# Patient Record
Sex: Male | Born: 1950 | Race: Black or African American | Hispanic: No | Marital: Married | Smoking: Never smoker
Health system: Southern US, Community
[De-identification: ages and names within clinical notes are randomized; demographics above are authoritative.]

## PROBLEM LIST (undated history)

## (undated) DIAGNOSIS — I10 Essential (primary) hypertension: Secondary | ICD-10-CM

## (undated) DIAGNOSIS — Z1211 Encounter for screening for malignant neoplasm of colon: Secondary | ICD-10-CM

---

## 2010-10-26 NOTE — Op Note (Unsigned)
PATIENT NAME:                 PA #:            MR #LEMOINE, GOYNE                 [id number]       [id number]            SURGEON:                              SURG DATE:  DIS DATE:          Irena Cords, MD                   10/26/2010  10/26/2010         DATE OF BIRTH:   AGE:           PATIENT TYPE:     RM #:              Jul 18, 1950       60             [facility]                                     COLONOSCOPY     REFERRING PHYSICIAN:  Marliss Coots, MD.     INSTRUMENT USED:  Olympus PCF-160 AL.     The patient was premedicated with Demerol 75 mg and a total of Versed 5 mg  intravenously.     INDICATIONS: The patient has a history of colonic polyps and last had a  colonoscopy in 2009.  He has guaiac positive stool.     PROCEDURE:  The digital and anal exams were normal.  The colonoscope was  inserted to the cecum.  The preparation was good.  Sigmoid diverticula were  incidentally noted.  No mucosal lesions were identified.     IMPRESSION:  Sigmoid diverticulosis only.       PLAN:  The patient requires no further evaluation.  He should undergo a  surveillance colonoscopy in 5 years.                                            Irena Cords, MD     [id number]  DD: 10/26/2010 15:44   DT: 10/27/2010 04:37   Job #: [id number]  CC: Elon Jester, MD  CC: Irena Cords, MD

## 2012-06-05 MED ORDER — FLUTICASONE PROPIONATE 50 MCG/ACT NA SUSP
50 MCG/ACT | Freq: Every day | NASAL | Status: DC
Start: 2012-06-05 — End: 2013-07-13

## 2012-06-05 NOTE — Progress Notes (Signed)
Subjective:       Brandon Mosley is a 62 y.o. male who presents for evaluation and treatment of allergic symptoms. Symptoms include: itchy eyes, nasal congestion, postnasal drip and sinus pressure and are present in a seasonal pattern. Precipitants include: none specifically recognized. Treatment currently includes nothing to date and is not effective.  Patient's medications, allergies, past medical, surgical, social and family histories were reviewed and updated as appropriate.    Review of Systems  Pertinent items are noted in HPI.  Known to me through the years with a history of polyps in the past and seasonal rhinitis, but not for several years. The patient is a non smoker and is not exposed to second hand smoke or irritants. There have been no known contagious contacts.  There are  no other symptoms referable to the upper aerodigestive tract.       Objective:      BP 159/106   Pulse 84   Ht 5\' 11"  (1.803 m)   Wt 240 lb (108.863 kg)   BMI 33.49 kg/m2   SpO2 99%  General appearance: alert, appears stated age and cooperative  Head: Normocephalic, without obvious abnormality, atraumatic, sinuses nontender to percussion  Eyes: negative findings: lids and lashes normal, conjunctivae and sclerae normal and pupils equal, round, reactive to light and accomodation  Ears: normal TM's and external ear canals both ears  Nose: mucoid discharge, mild congestion, turbinates swollen, swollen, grade 2 out of 4, no sinus tenderness, no polyps, no crusting or bleeding points  Throat: lips, mucosa, and tongue normal; teeth and gums normal      Assessment:      Allergic rhinitis.      Plan:      Medications: intranasal steroids: QNasl QD, Depo Medrol 1 cc IM.  Allergen avoidance discussed.  Follow-up in 2 weeks.

## 2013-07-21 MED ORDER — FLUTICASONE PROPIONATE 50 MCG/ACT NA SUSP
50 MCG/ACT | NASAL | Status: AC
Start: 2013-07-21 — End: ?

## 2014-04-20 ENCOUNTER — Encounter: Attending: Plastic Surgery within the Head & Neck | Primary: Family Medicine

## 2015-01-27 NOTE — Progress Notes (Signed)
[facility] PRE-OPERATIVE INSTRUCTIONS    Arrival time____0700________        Surgery time_0800___________    Take the following medications with a sip of water:    Do not eat or drink anything after 12:00 midnight prior to your surgery.EXCEPT PREP  This includes water chewing gum, mints and ice chips.   You may brush your teeth and gargle the morning of your surgery, but do not swallow the water    You may be asked to stop blood thinners such as Coumadin, Plavix, Fragmin, Lovenox, etc., or any anti-inflammatories such as:  Aspirin, Ibuprofen, Advil, Naproxen prior to your surgery.    We also ask that you stop any OTC medications such as fish oil, vitamin E, glucosamine, garlic, Multivitamins, COQ 10, etc.    We ask that you do not smoke 24 hours prior to surgery  We ask that you do not  drink any alcoholic beverages 24 hours prior to surgery     You must make arrangements for a responsible adult to take you home after your surgery.    For your safety you will not be allowed to leave alone or drive yourself home.  Your surgery will be cancelled if you do not have a ride home.     Also for your safety, it is strongly suggested that someone stay with you the first 24 hours after your surgery.     A parent or legal guardian must accompany a child scheduled for surgery and plan to stay at the hospital until the child is discharged.    Please do not bring other children with you.    For your comfort, please wear simple loose fitting clothing to the hospital.  Please do not bring valuables.    Do not wear any make-up or nail polish on your fingers or toes      For your safety, please do not wear any jewelry or body piercing's on the day of surgery.   All jewelry must be removed.      If you have dentures, they will be removed before going to operating room.    For your convenience, we will provide you with a container.    If you wear contact lenses or glasses, they will be removed, please bring a case for  them.     If you have a living will and a durable power of attorney for healthcare, please bring in a copy.     As part of our patient safety program to minimize surgical site infections, we ask you to do the following:    ?? Please notify your surgeon if you develop any illness between         now and the  day of your surgery.    ?? This includes a cough, cold, fever, sore throat, nausea,         or vomiting, and diarrhea, etc.  ??  Please notify your surgeon if you experience dizziness, shortness         of breath or blurred vision between now and the time of your surgery.     You may shower the night before surgery or the morning of   your surgery with an antibacterial soap.    You will need to bring a photo ID and insurance card    [facility] on[facility], would you like to utilize our pharmacy     If you will be staying overnight and use a C-pap  machine, please bring   your C-pap to hospital     Our goal is to provide you with excellent care, therefore, visitors will be limited to two(2) in the room at a time so that we may focus on providing this care for you.          Please contact pre-admission testing if you have any further questions.                 [facility] phone number:  [phone]  Please note these are generalized instructions for all surgical cases, you may be provided with more specific instructions according to your surgery.

## 2015-01-30 NOTE — Anesthesia Pre-Procedure Evaluation (Signed)
Department of Anesthesiology  Preprocedure Note       Name:  Brandon Mosley   Age:  65 y.o.  DOB:  1951-01-01                                          MRN:  [id number]         Date:  01/30/2015      Surgeon:    Procedure:    Medications prior to admission:   Prior to Admission medications    Medication Sig Start Date End Date Taking? Authorizing Provider   amLODIPine (NORVASC) 10 MG tablet Take 10 mg by mouth daily    Historical Provider, MD   Multiple Vitamins-Minerals (THERAPEUTIC MULTIVITAMIN-MINERALS) tablet Take 1 tablet by mouth daily    Historical Provider, MD   ferrous sulfate 325 (65 FE) MG tablet Take 325 mg by mouth daily (with breakfast)    Historical Provider, MD   fluticasone (FLONASE) 50 MCG/ACT nasal spray 1 SPRAY BY NASAL ROUTE DAILY. 07/13/13   Particia Nearing, MD   lisinopril (PRINIVIL;ZESTRIL) 10 MG tablet Take 40 mg by mouth daily     Historical Provider, MD   aspirin 81 MG tablet Take 81 mg by mouth daily.    Historical Provider, MD       Current medications:    Current Outpatient Prescriptions   Medication Sig Dispense Refill   ??? amLODIPine (NORVASC) 10 MG tablet Take 10 mg by mouth daily     ??? Multiple Vitamins-Minerals (THERAPEUTIC MULTIVITAMIN-MINERALS) tablet Take 1 tablet by mouth daily     ??? ferrous sulfate 325 (65 FE) MG tablet Take 325 mg by mouth daily (with breakfast)     ??? fluticasone (FLONASE) 50 MCG/ACT nasal spray 1 SPRAY BY NASAL ROUTE DAILY. 1 Bottle 2   ??? lisinopril (PRINIVIL;ZESTRIL) 10 MG tablet Take 40 mg by mouth daily      ??? aspirin 81 MG tablet Take 81 mg by mouth daily.       Current Facility-Administered Medications   Medication Dose Route Frequency Provider Last Rate Last Dose   ??? methylPREDNISolone acetate (DEPO-MEDROL) injection 40 mg  40 mg Intramuscular Once Particia Nearing, MD           Allergies:  No Known Allergies    Problem List:    Patient Active Problem List   Diagnosis Code   ??? Allergic rhinitis, seasonal J30.2       Past Medical History:        Diagnosis Date    ??? Hypertension        Past Surgical History:        Procedure Laterality Date   ??? Knee arthroplasty         Social History:    Social History   Substance Use Topics   ??? Smoking status: Former Smoker   ??? Smokeless tobacco: Not on file   ??? Alcohol use No                                Counseling given: Not Answered      Vital Signs (Current): There were no vitals filed for this visit.  BP Readings from Last 3 Encounters:   06/05/12 (!) 159/106       NPO Status:                                                                                 BMI:   Wt Readings from Last 3 Encounters:   01/27/15 245 lb (111.1 kg)   06/05/12 240 lb (108.9 kg)     There is no height or weight on file to calculate BMI.    Anesthesia Evaluation  Patient summary reviewed and Nursing notes reviewed no history of anesthetic complications:   Airway: Mallampati: II     Neck ROM: full   Dental: normal exam         Pulmonary:negative ROS and normal exam           Cardiovascular:negative ROS    (+) hypertension:,                Neuro/Psych:   {neg ROS     GI/Hepatic/Renal: neg ROS       (-) hiatal hernia and GERD     Endo/Other: negative ROS         Abdominal:                    Anesthesia Plan    ASA 3     general   (I discussed with the patient the risks and benefits of PIV, general anesthesia, IV Narcotics, PACU.  All questions were answered the patient agrees with the plan.)  intravenous induction         Pre-Operative Diagnosis: RECTAL BLEEDING/ [employer]    65 y.o.   BMI:  Body mass index is 34.17 kg/(m^2).     Vitals:    02/02/15 0723   BP: (!) 143/87   Pulse: 75   Resp: 16   Temp: 97.9 ??F (36.6 ??C)   TempSrc: Temporal   SpO2: 99%   Weight: 245 lb (111.1 kg)   Height:  (1.803 m)       No Known Allergies    Social History   Substance Use Topics   ??? Smoking status: Former Smoker   ??? Smokeless tobacco: Not on file   ??? Alcohol use No       LABS:    CBC  No results found for: WBC, HGB, HCT, PLT  RENAL  No  results found for: NA, K, CL, CO2, BUN, CREATININE, GLUCOSE  COAGS  No results found for: PROTIME, INR, APTT    Nash Mantis, MD   01/30/2015

## 2015-02-02 ENCOUNTER — Inpatient Hospital Stay: Admit: 2015-02-02 | Attending: Gastroenterology | Primary: Family Medicine

## 2015-02-02 MED ORDER — OXYCODONE-ACETAMINOPHEN 5-325 MG PO TABS
5-325 MG | ORAL | Status: AC | PRN
Start: 2015-02-02 — End: 2015-02-02

## 2015-02-02 MED ORDER — NORMAL SALINE FLUSH 0.9 % IV SOLN
0.9 % | Freq: Two times a day (BID) | INTRAVENOUS | Status: DC
Start: 2015-02-02 — End: 2015-02-03

## 2015-02-02 MED ORDER — MEPERIDINE HCL 25 MG/ML IJ SOLN
25 MG/ML | INTRAMUSCULAR | Status: DC | PRN
Start: 2015-02-02 — End: 2015-02-03

## 2015-02-02 MED ORDER — HYDROMORPHONE 0.5MG/0.5ML IJ SOLN
1 MG/ML | Status: DC | PRN
Start: 2015-02-02 — End: 2015-02-03

## 2015-02-02 MED ORDER — HYDRALAZINE HCL 20 MG/ML IJ SOLN
20 MG/ML | INTRAMUSCULAR | Status: DC | PRN
Start: 2015-02-02 — End: 2015-02-03

## 2015-02-02 MED ORDER — MORPHINE SULFATE (PF) 2 MG/ML IV SOLN
2 MG/ML | INTRAVENOUS | Status: DC | PRN
Start: 2015-02-02 — End: 2015-02-03

## 2015-02-02 MED ORDER — LABETALOL HCL 5 MG/ML IV SOLN
5 MG/ML | INTRAVENOUS | Status: DC | PRN
Start: 2015-02-02 — End: 2015-02-03

## 2015-02-02 MED ORDER — DIPHENHYDRAMINE HCL 50 MG/ML IJ SOLN
50 MG/ML | Freq: Once | INTRAMUSCULAR | Status: AC | PRN
Start: 2015-02-02 — End: 2015-02-02

## 2015-02-02 MED ORDER — ONDANSETRON HCL 4 MG/2ML IJ SOLN
4 MG/2ML | INTRAMUSCULAR | Status: DC | PRN
Start: 2015-02-02 — End: 2015-02-03

## 2015-02-02 MED ORDER — SODIUM CHLORIDE 0.9 % IV SOLN
0.9 % | INTRAVENOUS | Status: DC
Start: 2015-02-02 — End: 2015-02-03
  Administered 2015-02-02: 12:00:00 via INTRAVENOUS

## 2015-02-02 MED ORDER — NORMAL SALINE FLUSH 0.9 % IV SOLN
0.9 % | INTRAVENOUS | Status: DC | PRN
Start: 2015-02-02 — End: 2015-02-03

## 2015-02-02 MED ORDER — PROMETHAZINE HCL 25 MG/ML IJ SOLN
25 MG/ML | INTRAMUSCULAR | Status: DC | PRN
Start: 2015-02-02 — End: 2015-02-03

## 2015-02-02 NOTE — Anesthesia Post-Procedure Evaluation (Signed)
Postoperative Anesthesia Note    Name:    Brandon Mosley  MRN:      [id number]    Patient Vitals for the past 12 hrs:   BP Temp Temp src Pulse Resp SpO2 Height Weight   02/02/15 0838 130/84 - - 75 16 98 % - -   02/02/15 0828 118/83 - - 68 16 94 % - -   02/02/15 0818 122/82 97.5 ??F (36.4 ??C) Temporal 73 16 97 % - -   02/02/15 0723 (!) 143/87 97.9 ??F (36.6 ??C) Temporal 75 16 99 %  (1.803 m) 245 lb (111.1 kg)        LABS:    CBC  No results found for: WBC, HGB, HCT, PLT  RENAL  No results found for: NA, K, CL, CO2, BUN, CREATININE, GLUCOSE  COAGS  No results found for: PROTIME, INR, APTT    Intake & Output:  In: 620 [P.O.:120; I.V.:500]  Out: -     Nausea & Vomiting:  No    Level of Consciousness:  Awake    Pain Assessment:  Adequate analgesia    Anesthesia Complications:  No apparent anesthetic complications    SUMMARY      Vital signs stable  OK to discharge from Stage I post anesthesia care.  Care transferred from Anesthesiology department on discharge from perioperative area

## 2015-02-02 NOTE — Procedures (Signed)
[facility]             [address] [place], [place]  [address] [phone]                                      COLONOSCOPY    PATIENT NAME:  Brandon Mosley, Brandon Mosley                    DOB:      November 12, 1950  MED REC NO:    [id number]                       ROOM:        ACCOUNT NO:    [id number]                       ADMISSION DATE: 02/02/2015  PHYSICIAN:     Loraine Leriche A. Brinda Focht, MD                DATE OF PROCEDURE:  02/02/2015    This is a 65 year old male outpatient.    REFERRING PHYSICIAN:  Dr. Marliss Coots.    INSTRUMENT USED:  The Olympus PCFH180AL.    The patient was premedicated with Diprivan intravenously as administered by  the anesthesiology service.    INDICATIONS:  The patient has presented with recent rectal bleeding.  There is  a previous history of colonic polyps.  The patient's last colonoscopy was in  2012.    PROCEDURE:  The digital and anal exams were remarkable for prominent external  hemorrhoids.  The colonoscope was inserted to the cecum.  The prep was good to  excellent.  No mucosal lesions were identified.  There was incidental sigmoid  diverticulosis.    IMPRESSION:  1.  External hemorrhoids.  This would explain the patient's gross bleeding.  2.  Sigmoid diverticulosis.    PLAN:  The patient's bleeding has stopped.  If it is recurrent, it can be  treated with topical hydrocortisone.  The patient should undergo a  surveillance colonoscopy in 5 years.        Brandon Henslee A. Lorrene Reid, MD      D: 02/02/2015 08:21:37  T: 02/02/2015 09:32:51  MAM/nts  Job#: [id number]  Doc#: [id number]    cc:  Loraine Leriche A. Lorrene Reid, MD       [facility]

## 2015-02-02 NOTE — H&P (Signed)
[facility]   Pre-operative History and Physical    Patient: Brandon Mosley  DOB: 1950-02-16  Acct#:         HISTORY OF PRESENT ILLNESS:    The patient is a 65 y.o. male  who presents with rectal bleeding  Past Medical History:        Diagnosis Date   ??? Hypertension      Past Surgical History:        Procedure Laterality Date   ??? Knee arthroplasty       Medications Prior to Admission:   Current Outpatient Prescriptions on File Prior to Encounter   Medication Sig Dispense Refill   ??? amLODIPine (NORVASC) 10 MG tablet Take 10 mg by mouth daily     ??? Multiple Vitamins-Minerals (THERAPEUTIC MULTIVITAMIN-MINERALS) tablet Take 1 tablet by mouth daily     ??? ferrous sulfate 325 (65 FE) MG tablet Take 325 mg by mouth daily (with breakfast)     ??? lisinopril (PRINIVIL;ZESTRIL) 10 MG tablet Take 40 mg by mouth daily      ??? aspirin 81 MG tablet Take 81 mg by mouth daily.     ??? fluticasone (FLONASE) 50 MCG/ACT nasal spray 1 SPRAY BY NASAL ROUTE DAILY. 1 Bottle 2     Current Facility-Administered Medications on File Prior to Encounter   Medication Dose Route Frequency Provider Last Rate Last Dose   ??? methylPREDNISolone acetate (DEPO-MEDROL) injection 40 mg  40 mg Intramuscular Once Particia Nearing, MD         Allergies:  Review of patient's allergies indicates no known allergies.    Social History:      Social History     Social History   ??? Marital status: Married     Spouse name: N/A   ??? Number of children: N/A   ??? Years of education: N/A     Occupational History   ??? Not on file.     Social History Main Topics   ??? Smoking status: Former Smoker   ??? Smokeless tobacco: Not on file   ??? Alcohol use No   ??? Drug use: No   ??? Sexual activity: Not on file     Other Topics Concern   ??? Not on file     Social History Narrative           Family History:   History reviewed. No pertinent family history.      PHYSICAL EXAM:      Visit Vitals   ??? BP (!) 143/87   ??? Pulse 75   ??? Temp 97.9 ??F (36.6 ??C) (Temporal)   ??? Resp 16   ??? Ht  (1.803 m)   ???  Wt 245 lb (111.1 kg)   ??? SpO2 99%   ??? BMI 34.17 kg/m2    I        Heart: Normal    Lungs: Normal    Abdomen: Normal      ASA Grade: ASA 3 - Patient with moderate systemic disease with functional limitations    II (soft palate, uvula, fauces visible)  ASSESSMENT AND PLAN:    1.  Patient is a 65 y.o. male here for Colonoscopy  2.  Procedure options, risks and benefits reviewed with patient who expresses understanding.

## 2015-02-02 NOTE — Progress Notes (Signed)
Drank Juice without difficulty. Patient and wife voice understanding of discharge instructions.c

## 2015-02-02 NOTE — Progress Notes (Signed)
1.  Identify name and date of birth.  2.  Patient remains free from signs and symptoms of injury.  3.  Patient receives appropriate medication(s), safely administered during the       Perioperative period.  4.  The patient is free from signs and symptoms of infection.  5.  The patient has wound/tissur perfusion.  6.  The patient's fluid, electrolyte, and acid-base balances are established perioperatively.  7.  The patient's pulmonary function is established preoperatively.  8.  The patient's cardiovascular status is established perioperatively.  9.  The patient/caregiver demonstrates knowledge of nutritional management related to the operative or other invasive procedure.  10.  The patient/caregiver demonstrates knowledge of medication management.  11.  The patient/caregiver demonstrates knowledge of pain management.  12.  The patient participates in the rehabilitation process as applicable.  13.  The patient/caregiver participates in decisions in decisions affecting his or her Perioperative plan of care.  14.  The patients care is consistent with the individualized Perioperative plan of care.  15.  The patients right to privacy is maintained.  16.  The patient is the recipient of competent and ethical care within legal standards of practice.  17.  The patient's value system, lifestyle, ethnicity, and culture are considered, respected, and incorporated int the perioperative plan of care and understands special services available.  18.  The patient demonstrates and/or reports adequate pain control throughout the perioperative period.  19.  The patient's neurological status is established preoperatively.  20.  The patient/caregiver demonstrates knowledge of the expected responses to the operative or invasive procedure.  21.  Patient/caregiver has reduced anxiety.  Interventions - Familiarize with environment and equipment.  22.  Patient/caregiver verbalizes understanding of Phase I and Phase II process.  23.  Patient  pain level is established preoperatively using age appropriate pain scale.

## 2015-02-02 NOTE — Op Note (Signed)
Colonoscopy - dictated.

## 2015-02-02 NOTE — Brief Op Note (Signed)
Brief Postoperative Note    Brandon Mosley  Date of Birth:  1951/01/02  [id number]    Pre-operative Diagnosis: Rectal bleeding    Post-operative Diagnosis: Same    Procedure: Colonoscopy    Anesthesia: MAC    Surgeons/Assistants: Victorhugo Preis    Estimated Blood Loss: None    Complications: None    Specimens: Was Not Obtained    Findings: See dictated report    Electronically signed by Irena Cords, MD on 02/02/2015 at 8:17 AM

## 2015-02-02 NOTE — Other (Signed)
Medications administered and monitored by CRNA, see anesthesia record.

## 2015-02-02 NOTE — Progress Notes (Signed)
1.  Patient/Caregiver identifies-states name and date of birth  2.  The patient is free from signs & symptoms of chemical, electrical, laser, radiation, positioning or transfer/transport injury.  3.  The patient receives appropriate medication(s) safely administered during the Perioperative period.  4.  The patient has wound/tissue perfusion consistent with or improved from baseline levels established preoperatively  5. The patient is at or returning to normothermia at the conclusion of the immediate postoperative period.  6.  The patient's fluid, electrolyte, and acid base balances are consistent with or improved from baseline levels established preoperatively  7.  The patient's pulmonary function is consistent with or improved from baseline levels established preoperatively  8.  The patient's cardiovascular status is consistent with or improved from baseline levels established preoperatively  9.  The patient/caregiver demonstrates knowledge of nutritional management related to the operative or other invasive procedure.  10.  The patient/caregiver demonstrates knowledge of medication, pain, and wound management.  11.  The patient participates in the rehabilitation process as applicable  12.  The patient/caregiver participates in decisions affecting his or her Perioperative plan of care  13.  The patient's care is consistent with the individualized Perioperative plan of care  14.  The patient's right to privacy is maintained.  15.  The patient is the recipient of competent and ethical care within legal standards of practice.  16. The patient's value system, lifestyle, ethnicity, and culture are considered, respected, and incorporated in the Perioperative plan of care. Verbalizes understanding of special services available.  17.  The patient demonstrated and/or reports adequate pain control throughout the Perioperative period.  18.  The patient's neurological status is consistent with or improved from baseline levels  established preoperatively.  19.  The patient/caregiver demonstrates knowledge of the expected responses to the operative or invasive procedure.  20.  Patient/Caregiver has reduced anxiety. Interventions-Familiarize with environment and equipment.  21.  Other:  22.  Other:

## 2015-02-03 MED FILL — FRESENIUS PROPOVEN 200 MG/20ML IV EMUL: 200 MG/20ML | INTRAVENOUS | Qty: 20

## 2015-02-03 MED FILL — LIDOCAINE HCL (PF) 2 % IJ SOLN: 2 % | INTRAMUSCULAR | Qty: 5

## 2015-03-08 ENCOUNTER — Encounter: Attending: Plastic Surgery within the Head & Neck | Primary: Family Medicine

## 2016-10-01 ENCOUNTER — Emergency Department (HOSPITAL_COMMUNITY)
Admission: EM | Admit: 2016-10-01 | Discharge: 2016-10-02 | Disposition: A | Discharge status: Home / Self Care | Payer: 59 | Attending: Emergency Medicine | Admitting: Emergency Medicine

## 2016-10-01 ENCOUNTER — Encounter (HOSPITAL_COMMUNITY): Payer: Self-pay | Admitting: Emergency Medicine

## 2016-10-01 ENCOUNTER — Emergency Department (HOSPITAL_COMMUNITY): Payer: 59

## 2016-10-01 DIAGNOSIS — Y929 Unspecified place or not applicable: Secondary | ICD-10-CM | POA: Diagnosis not present

## 2016-10-01 DIAGNOSIS — M545 Low back pain, unspecified: Secondary | ICD-10-CM

## 2016-10-01 DIAGNOSIS — S32028A Other fracture of second lumbar vertebra, initial encounter for closed fracture: Secondary | ICD-10-CM | POA: Insufficient documentation

## 2016-10-01 DIAGNOSIS — S63285A Dislocation of proximal interphalangeal joint of left ring finger, initial encounter: Secondary | ICD-10-CM | POA: Insufficient documentation

## 2016-10-01 DIAGNOSIS — I10 Essential (primary) hypertension: Secondary | ICD-10-CM | POA: Diagnosis not present

## 2016-10-01 DIAGNOSIS — Y9389 Activity, other specified: Secondary | ICD-10-CM | POA: Insufficient documentation

## 2016-10-01 DIAGNOSIS — M542 Cervicalgia: Secondary | ICD-10-CM | POA: Diagnosis not present

## 2016-10-01 DIAGNOSIS — S63283A Dislocation of proximal interphalangeal joint of left middle finger, initial encounter: Secondary | ICD-10-CM | POA: Insufficient documentation

## 2016-10-01 DIAGNOSIS — S2232XA Fracture of one rib, left side, initial encounter for closed fracture: Secondary | ICD-10-CM

## 2016-10-01 DIAGNOSIS — W11XXXA Fall on and from ladder, initial encounter: Secondary | ICD-10-CM | POA: Insufficient documentation

## 2016-10-01 DIAGNOSIS — S62115A Nondisplaced fracture of triquetrum [cuneiform] bone, left wrist, initial encounter for closed fracture: Secondary | ICD-10-CM | POA: Insufficient documentation

## 2016-10-01 DIAGNOSIS — S63275A Dislocation of unspecified interphalangeal joint of left ring finger, initial encounter: Secondary | ICD-10-CM

## 2016-10-01 DIAGNOSIS — Z79899 Other long term (current) drug therapy: Secondary | ICD-10-CM | POA: Diagnosis not present

## 2016-10-01 DIAGNOSIS — G311 Senile degeneration of brain, not elsewhere classified: Secondary | ICD-10-CM | POA: Insufficient documentation

## 2016-10-01 DIAGNOSIS — S6992XA Unspecified injury of left wrist, hand and finger(s), initial encounter: Secondary | ICD-10-CM | POA: Diagnosis present

## 2016-10-01 DIAGNOSIS — Y998 Other external cause status: Secondary | ICD-10-CM | POA: Insufficient documentation

## 2016-10-01 DIAGNOSIS — S63273A Dislocation of unspecified interphalangeal joint of left middle finger, initial encounter: Secondary | ICD-10-CM

## 2016-10-01 HISTORY — DX: Essential (primary) hypertension: I10

## 2016-10-01 MED ORDER — SODIUM CHLORIDE 0.9 % IV BOLUS (SEPSIS)
500.0000 mL | Freq: Once | INTRAVENOUS | Status: AC
  Administered 2016-10-02: 500 mL via INTRAVENOUS

## 2016-10-01 MED ORDER — FENTANYL CITRATE (PF) 100 MCG/2ML IJ SOLN
INTRAMUSCULAR | Status: AC
  Administered 2016-10-01: 50 ug via NASAL
  Filled 2016-10-01: qty 2

## 2016-10-01 MED ORDER — ONDANSETRON HCL 4 MG/2ML IJ SOLN
4.0000 mg | Freq: Once | INTRAMUSCULAR | Status: AC
  Administered 2016-10-02: 4 mg via INTRAVENOUS
  Filled 2016-10-01: qty 2

## 2016-10-01 MED ORDER — MORPHINE SULFATE (PF) 4 MG/ML IV SOLN
4.0000 mg | Freq: Once | INTRAVENOUS | Status: AC
  Administered 2016-10-02: 4 mg via INTRAVENOUS
  Filled 2016-10-01: qty 1

## 2016-10-01 MED ORDER — FENTANYL CITRATE (PF) 100 MCG/2ML IJ SOLN: 50.0000 ug | INTRAMUSCULAR | Status: DC | PRN

## 2016-10-01 MED ORDER — OXYCODONE-ACETAMINOPHEN 5-325 MG PO TABS
1.0000 | ORAL_TABLET | ORAL | Status: DC | PRN
  Administered 2016-10-01: 1 via ORAL

## 2016-10-01 MED ORDER — OXYCODONE-ACETAMINOPHEN 5-325 MG PO TABS
ORAL_TABLET | ORAL | Status: AC
  Filled 2016-10-01: qty 1

## 2016-10-01 NOTE — ED Notes (Signed)
Pt. Fell maybe 15 feet.

## 2016-10-01 NOTE — ED Notes (Signed)
Patient in triage waiting in recliner, helps pain a bit. Pain =7/10. Family with patient.

## 2016-10-01 NOTE — ED Notes (Signed)
Family up to [facility] to say patient is in excruciating pain. Patient brought to Triage Waiting and will give Triage pain meds.

## 2016-10-01 NOTE — ED Provider Notes (Signed)
[facility]-EMERGENCY DEPT Provider Note   CSN: [id number] Arrival date & time: 10/01/16  1757     History   Chief Complaint Chief Complaint  Patient presents with  . Fall    fall from ladder, about 15 feet, about 1h ago. No LOC    HPI James Odonnell is a 66 y.o. male.  James Odonnell is a 66 y.o. Male who presents to the ED after a fall off a ladder prior to arrival. Patient repots he was up on a ladder about 15 feet in a field trimming trees when he fell onto the ground hitting his back. He denies hitting his head or LOC. He reports pain to his mid to lower back that radiates around into his chest. He denies shortness of breath. He also reports developing some mild neck pain diffusely. He also complains of pain to his left hand and wrist as he braced his fall with his hand. He received fentanyl and Percocet in the emergency department with some relief of his pain. He ambulated after the fall prior to arrival to the emergency department. He denies fevers, headache, loss of consciousness, loss of bladder control, loss of bowel control, numbness, tingling, weakness or rashes.   The history is provided by the patient and medical records. No language interpreter was used.  Fall  Associated symptoms include chest pain. Pertinent negatives include no abdominal pain, no headaches and no shortness of breath.    Past Medical History:  Diagnosis Date  . Hypertension     There are no active problems to display for this patient.   No past surgical history on file.     Home Medications    Prior to Admission medications   Medication Sig Start Date End Date Taking? Authorizing Provider  amLODipine (NORVASC) 10 MG tablet Take 10 mg by mouth daily. 08/27/16  Yes [provider]  hydrALAZINE (APRESOLINE) 50 MG tablet Take 50 mg by mouth daily. 07/21/16  Yes [provider]  lisinopril (PRINIVIL,ZESTRIL) 40 MG tablet Take 40 mg by mouth daily. 08/19/16  Yes [provider]     Family History No family history on file.  Social History Social History  Substance Use Topics  . Smoking status: Never Smoker  . Smokeless tobacco: Never Used  . Alcohol use Not on file     Allergies   Patient has no known allergies.   Review of Systems Review of Systems  Constitutional: Negative for chills and fever.  HENT: Negative for nosebleeds.   Eyes: Negative for visual disturbance.  Respiratory: Negative for cough and shortness of breath.   Cardiovascular: Positive for chest pain.  Gastrointestinal: Negative for abdominal pain, diarrhea, nausea and vomiting.  Genitourinary: Negative for difficulty urinating, dysuria and hematuria.  Musculoskeletal: Positive for arthralgias, back pain and neck pain.  Skin: Negative for rash and wound.  Neurological: Negative for dizziness, syncope, weakness, light-headedness, numbness and headaches.     Physical Exam Updated Vital Signs BP 129/75 (BP Location: Right Arm)   Pulse 85   Temp 97.8 F (36.6 C) (Oral)   Resp 14   Ht 5\' 11"  (1.803 m)   Wt 106.6 kg (235 lb)   SpO2 95%   BMI 32.78 kg/m   Physical Exam  Constitutional: He appears well-developed and well-nourished. No distress.  Nontoxic appearing.  HENT:  Head: Normocephalic and atraumatic.  Right Ear: External ear normal.  Left Ear: External ear normal.  Mouth/Throat: Oropharynx is clear and moist.  Eyes: Pupils are equal, round,  and reactive to light. Conjunctivae and EOM are normal. Right eye exhibits no discharge. Left eye exhibits no discharge.  Neck: Normal range of motion. Neck supple. No JVD present. No tracheal deviation present.  No midline neck tenderness to palpation.  Cardiovascular: Normal rate, regular rhythm, normal heart sounds and intact distal pulses.  Exam reveals no gallop and no friction rub.   No murmur heard. Bilateral radial and dorsalis pedis pulses are intact.  Pulmonary/Chest: Effort normal and breath sounds normal. No stridor.  No respiratory distress. He has no wheezes. He has no rales. He exhibits no tenderness.  Lungs are clear to ascultation bilaterally. Symmetric chest expansion bilaterally. No increased work of breathing. No rales or rhonchi.  No crepitus.   Abdominal: Soft. There is no tenderness.  Musculoskeletal: Normal range of motion. He exhibits tenderness. He exhibits no edema or deformity.  Patient has tenderness mildly to his left lateral wrist and 3rd and 4th fingers at PIP joint with probable dislocation. Good range of motion. No crackles or malalignment. No deformity noted. No tenderness to palpation to his midline cervical, thoracic or lumbar spine. No crepitus or deformity. No overlying skin changes. He is moving his bilateral upper and lower extremities with good strength and without difficulty. His bilateral clavicles are nontender to palpation. His bilateral shoulder, elbow, hip, knee and ankle joints are supple and nontender to palpation.  Lymphadenopathy:    He has no cervical adenopathy.  Neurological: He is alert. No sensory deficit. He exhibits normal muscle tone. Coordination normal.  Good strength in his bilateral upper and lower extremities. Strength and sensation is intact in his bilateral upper and lower extremities. Good strength with plantar and dorsiflexion bilaterally. Good and equal grip strengths bilaterally.  Skin: Skin is warm and dry. No rash noted. He is not diaphoretic. No erythema. No pallor.  Psychiatric: He has a normal mood and affect. His behavior is normal.  Nursing note and vitals reviewed.    ED Treatments / Results  Labs (all labs ordered are listed, but only abnormal results are displayed) Labs Reviewed  COMPREHENSIVE METABOLIC PANEL - Abnormal; Notable for the following:       Result Value   Glucose, Bld 128 (*)    BUN 25 (*)    Creatinine, Ser 1.62 (*)    AST 122 (*)    GFR calc non Af Amer 43 (*)    GFR calc Af Amer 49 (*)    All other components within  normal limits  CBC - Abnormal; Notable for the following:    WBC 14.8 (*)    Hemoglobin 12.6 (*)    HCT 38.2 (*)    MCV 66.3 (*)    MCH 21.9 (*)    RDW 15.6 (*)    All other components within normal limits  I-STAT CHEM 8, ED - Abnormal; Notable for the following:    BUN 27 (*)    Creatinine, Ser 1.50 (*)    Glucose, Bld 129 (*)    Calcium, Ion 1.14 (*)    All other components within normal limits  PROTIME-INR  URINALYSIS, ROUTINE W REFLEX MICROSCOPIC    EKG  EKG Interpretation None       Radiology Dg Wrist Complete Left  Result Date: 10/01/2016 CLINICAL DATA:  15 foot fall from ladder.  Left wrist pain. EXAM: LEFT WRIST - COMPLETE 3+ VIEW COMPARISON:  None. FINDINGS: Tiny linear lucency along the distal aspect of the triquetrum bone may represent nondisplaced fracture. Mild dorsal soft  tissue swelling. No other fracture or dislocation identified. No radiopaque soft tissue foreign bodies. No focal bone lesions. IMPRESSION: Possible nondisplaced fracture along the distal aspect of the triquetrum bone. Electronically Signed   By: Burman Nieves M.D.   On: 10/01/2016 23:47   Ct Head Wo Contrast  Result Date: 10/02/2016 CLINICAL DATA:  Patient fell 15 feet from a ladder. History of hypertension. EXAM: CT HEAD WITHOUT CONTRAST CT CERVICAL SPINE WITHOUT CONTRAST TECHNIQUE: Multidetector CT imaging of the head and cervical spine was performed following the standard protocol without intravenous contrast. Multiplanar CT image reconstructions of the cervical spine were also generated. COMPARISON:  None. FINDINGS: CT HEAD FINDINGS Brain: Mild diffuse cerebral atrophy. No ventricular dilatation. No mass effect or midline shift. No abnormal extra-axial fluid collections. Gray-white matter junctions are distinct. Basal cisterns are not effaced. No acute intracranial hemorrhage. Vascular: No hyperdense vessel or unexpected calcification. Skull: Calvarium appears intact. No acute depressed  fractures identified. Sinuses/Orbits: Mucosal thickening in the paranasal sinuses. No acute air-fluid levels. Mastoid air cells are not opacified. Other: None. CT CERVICAL SPINE FINDINGS Alignment: Reversal of the usual cervical lordosis without anterior subluxation. This is likely due to patient positioning but ligamentous injury or muscle spasm could also have this appearance and are not excluded. Normal alignment of the facet joints. C1-2 articulation appears intact. Skull base and vertebrae: No vertebral compression deformities. No focal bone lesion or bone destruction. 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 odontoid process. 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 Soft tissues and spinal canal: No prevertebral soft tissue swelling. No paraspinal infiltration. Disc levels: Degenerative changes throughout the cervical spine with narrowed interspaces and associated endplate hypertrophic changes. Changes are most prominent at C4-5 and C5-6. Prominent posterior disc osteophyte complex at C4-5 causes some effacement of the anterior thecal sac. No significant stenosis. Upper chest: Lung apices are clear. Other: None. IMPRESSION: 1. No acute intracranial abnormalities.  Mild diffuse atrophy. 2. Nonspecific reversal of the usual cervical lordosis. No acute displaced fractures identified in the cervical spine. Degenerative changes. Electronically Signed   By: Burman Nieves M.D.   On: 10/02/2016 01:11   Ct Chest W Contrast  Result Date: 10/02/2016 CLINICAL DATA:  Patient fell 15 feet from a ladder. History of hypertension. EXAM: CT CHEST, ABDOMEN, AND PELVIS WITH CONTRAST TECHNIQUE: Multidetector CT imaging of the chest, abdomen and pelvis was performed following the standard protocol during bolus administration of intravenous contrast. CONTRAST:  ISOVUE-300 IOPAMIDOL (ISOVUE-300) INJECTION 61% COMPARISON:  None. FINDINGS: CT  CHEST FINDINGS Cardiovascular: Normal heart size. No pericardial effusion. Normal caliber thoracic aorta. Aorta and great vessel origins are patent. No evidence of dissection or aneurysm. Mediastinum/Nodes: No enlarged mediastinal, hilar, or axillary lymph nodes. Thyroid gland, trachea, and esophagus demonstrate no significant findings. Lungs/Pleura: Bilateral lung base opacities may represent atelectasis or small contusions. No pleural effusions. No pneumothorax. Airways are patent. Musculoskeletal: Normal alignment of the thoracic spine. Diffuse degenerative changes. No vertebral compression deformities. No sternal depression. Oblique fracture of the posterior left eighth rib without significant depression. Small adjacent paraspinal hematoma. CT ABDOMEN PELVIS FINDINGS Hepatobiliary: No hepatic injury or perihepatic hematoma. Gallbladder is unremarkable Pancreas: Unremarkable. No pancreatic ductal dilatation or surrounding inflammatory changes. Spleen: No splenic injury or perisplenic hematoma. Adrenals/Urinary Tract: No adrenal hemorrhage or renal injury identified. Bladder is unremarkable. Stomach/Bowel: Stomach is within normal limits. Appendix appears normal. No evidence of bowel wall thickening, distention,  or inflammatory changes. Vascular/Lymphatic: No significant vascular findings are present. No enlarged abdominal or pelvic lymph nodes. Reproductive: Prostate is unremarkable. Other: No free air or free fluid in the abdomen. Abdominal wall musculature appears intact. Infiltration in the subcutaneous fat lateral to the left hip consistent with contusion/hematoma. Musculoskeletal: Normal alignment of the lumbar spine. Oblique fracture at the anterior superior endplate of the L2 vertebral body. No displacement or retropulsion of fracture fragments. No significant vertebral compression. Pelvis, sacrum, and hips appear intact. Tiny ossicle posterior to the left ischium likely represents an old ununited ossicle.  Can't entirely exclude a small avulsion fragment. IMPRESSION: 1. Oblique fracture of the posterior left eighth rib without depression. 2. Bilateral lung base opacities may represent atelectasis or small contusions. 3. No evidence of mediastinal or aortic injury. 4. Oblique fracture of the anterior superior endplate of the L2 vertebral body without compression. 5. No evidence of solid organ injury or bowel perforation. Electronically Signed   By: Burman Nieves M.D.   On: 10/02/2016 01:42   Ct Cervical Spine Wo Contrast  Result Date: 10/02/2016 CLINICAL DATA:  Patient fell 15 feet from a ladder. History of hypertension. EXAM: CT HEAD WITHOUT CONTRAST CT CERVICAL SPINE WITHOUT CONTRAST TECHNIQUE: Multidetector CT imaging of the head and cervical spine was performed following the standard protocol without intravenous contrast. Multiplanar CT image reconstructions of the cervical spine were also generated. COMPARISON:  None. FINDINGS: CT HEAD FINDINGS Brain: Mild diffuse cerebral atrophy. No ventricular dilatation. No mass effect or midline shift. No abnormal extra-axial fluid collections. Gray-white matter junctions are distinct. Basal cisterns are not effaced. No acute intracranial hemorrhage. Vascular: No hyperdense vessel or unexpected calcification. Skull: Calvarium appears intact. No acute depressed fractures identified. Sinuses/Orbits: Mucosal thickening in the paranasal sinuses. No acute air-fluid levels. Mastoid air cells are not opacified. Other: None. CT CERVICAL SPINE FINDINGS Alignment: Reversal of the usual cervical lordosis without anterior subluxation. This is likely due to patient positioning but ligamentous injury or muscle spasm could also have this appearance and are not excluded. Normal alignment of the facet joints. C1-2 articulation appears intact. Skull base and vertebrae: No vertebral compression deformities. No focal bone lesion or bone destruction. Partial coalition of the posterior  elements at C2-3 on the left. Degenerative cyst in the odontoid process. 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 Soft tissues and spinal canal: No prevertebral soft tissue swelling. No paraspinal infiltration. Disc levels: Degenerative changes throughout the cervical spine with narrowed interspaces and associated endplate hypertrophic changes. Changes are most prominent at C4-5 and C5-6. Prominent posterior disc osteophyte complex at C4-5 causes some effacement of the anterior thecal sac. No significant stenosis. Upper chest: Lung apices are clear. Other: None. IMPRESSION: 1. No acute intracranial abnormalities.  Mild diffuse atrophy. 2. Nonspecific reversal of the usual cervical lordosis. No acute displaced fractures identified in the cervical spine. Degenerative changes. Electronically Signed   By: Burman Nieves M.D.   On: 10/02/2016 01:11   Ct Abdomen Pelvis W Contrast  Result Date: 10/02/2016 CLINICAL DATA:  Patient fell 15 feet from a ladder. History of hypertension. EXAM: CT CHEST, ABDOMEN, AND PELVIS WITH CONTRAST TECHNIQUE: Multidetector CT imaging of the chest, abdomen and pelvis was performed following the standard protocol during bolus administration of intravenous contrast. CONTRAST:  ISOVUE-300 IOPAMIDOL (ISOVUE-300) INJECTION 61% COMPARISON:  None. FINDINGS: CT CHEST FINDINGS Cardiovascular: Normal heart size. No pericardial effusion. Normal caliber thoracic aorta. Aorta and great vessel origins are patent. No  evidence of dissection or aneurysm. Mediastinum/Nodes: No enlarged mediastinal, hilar, or axillary lymph nodes. Thyroid gland, trachea, and esophagus demonstrate no significant findings. Lungs/Pleura: Bilateral lung base opacities may represent atelectasis or small contusions. No pleural effusions. No pneumothorax. Airways are patent. Musculoskeletal: Normal alignment of the thoracic spine. Diffuse degenerative changes. No vertebral compression  deformities. No sternal depression. Oblique fracture of the posterior left eighth rib without significant depression. Small adjacent paraspinal hematoma. CT ABDOMEN PELVIS FINDINGS Hepatobiliary: No hepatic injury or perihepatic hematoma. Gallbladder is unremarkable Pancreas: Unremarkable. No pancreatic ductal dilatation or surrounding inflammatory changes. Spleen: No splenic injury or perisplenic hematoma. Adrenals/Urinary Tract: No adrenal hemorrhage or renal injury identified. Bladder is unremarkable. Stomach/Bowel: Stomach is within normal limits. Appendix appears normal. No evidence of bowel wall thickening, distention, or inflammatory changes. Vascular/Lymphatic: No significant vascular findings are present. No enlarged abdominal or pelvic lymph nodes. Reproductive: Prostate is unremarkable. Other: No free air or free fluid in the abdomen. Abdominal wall musculature appears intact. Infiltration in the subcutaneous fat lateral to the left hip consistent with contusion/hematoma. Musculoskeletal: Normal alignment of the lumbar spine. Oblique fracture at the anterior superior endplate of the L2 vertebral body. No displacement or retropulsion of fracture fragments. No significant vertebral compression. Pelvis, sacrum, and hips appear intact. Tiny ossicle posterior to the left ischium likely represents an old ununited ossicle. Can't entirely exclude a small avulsion fragment. IMPRESSION: 1. Oblique fracture of the posterior left eighth rib without depression. 2. Bilateral lung base opacities may represent atelectasis or small contusions. 3. No evidence of mediastinal or aortic injury. 4. Oblique fracture of the anterior superior endplate of the L2 vertebral body without compression. 5. No evidence of solid organ injury or bowel perforation. Electronically Signed   By: Burman Nieves M.D.   On: 10/02/2016 01:42   Dg Chest Port 1 View  Result Date: 10/01/2016 CLINICAL DATA:  Fall from ladder 15 feet a couple of  hours ago. Back pain radiating to the chest. EXAM: PORTABLE CHEST 1 VIEW COMPARISON:  None. FINDINGS: Shallow inspiration. Normal heart size and pulmonary vascularity. No focal airspace disease or consolidation in the lungs. No blunting of costophrenic angles. No pneumothorax. Mediastinal contours appear intact. IMPRESSION: No active disease. Electronically Signed   By: Burman Nieves M.D.   On: 10/01/2016 23:44   Dg Hand Complete Left  Result Date: 10/01/2016 CLINICAL DATA:  15 foot fall from ladder a couple of hours ago. Left hand pain. EXAM: LEFT HAND - COMPLETE 3+ VIEW COMPARISON:  None. FINDINGS: This is a limited single view study. A lateral view would help for confirmation, but there appears to be dislocation of the proximal interphalangeal joints at the third and fourth fingers. Chronic degenerative changes in the distal interphalangeal joints. Soft tissues are unremarkable. IMPRESSION: Dislocation of the proximal interphalangeal joints at the left third and fourth fingers. Electronically Signed   By: Burman Nieves M.D.   On: 10/01/2016 23:45    Procedures Reduction of dislocation Date/Time: 10/02/2016 12:57 AM Performed by: Everlene Farrier Authorized by: Everlene Farrier  Consent: Verbal consent obtained. Risks and benefits: risks, benefits and alternatives were discussed Consent given by: patient Patient understanding: patient states understanding of the procedure being performed Site marked: the operative site was marked Imaging studies: imaging studies available Required items: required blood products, implants, devices, and special equipment available Patient identity confirmed: verbally with patient Time out: Immediately prior to procedure a "time out" was called to verify the correct patient, procedure, equipment, support staff and  site/side marked as required. Local anesthesia used: yes Anesthesia: digital block  Anesthesia: Local anesthesia used: yes Local Anesthetic:  lidocaine 1% without epinephrine Anesthetic total: 3 mL  Sedation: Patient sedated: no Patient tolerance: Patient tolerated the procedure well with no immediate complications Comments: Reduction of dislocation of left 3rd finger at PIP joint.   Reduction of dislocation Date/Time: 10/02/2016 1:09 AM Performed by: Everlene Farrier Authorized by: Everlene Farrier  Consent: Verbal consent obtained. Risks and benefits: risks, benefits and alternatives were discussed Consent given by: patient Patient understanding: patient states understanding of the procedure being performed Site marked: the operative site was marked Imaging studies: imaging studies available Required items: required blood products, implants, devices, and special equipment available Patient identity confirmed: verbally with patient Time out: Immediately prior to procedure a "time out" was called to verify the correct patient, procedure, equipment, support staff and site/side marked as required. Local anesthesia used: yes Anesthesia: digital block  Anesthesia: Local anesthesia used: yes Local Anesthetic: lidocaine 1% without epinephrine Anesthetic total: 3 mL  Sedation: Patient sedated: no Patient tolerance: Patient tolerated the procedure well with no immediate complications Comments: Reduction of dislocation of left 4th finger at PIP joint.     (including critical care time)  Medications Ordered in ED Medications  oxyCODONE-acetaminophen (PERCOCET/ROXICET) 5-325 MG per tablet 1 tablet (1 tablet Oral Given 10/01/16 1859)  fentaNYL (SUBLIMAZE) injection 50 mcg (not administered)  sodium chloride 0.9 % bolus 500 mL (not administered)  fentaNYL (SUBLIMAZE) 100 MCG/2ML injection (50 mcg Right Nare Given 10/01/16 2052)  sodium chloride 0.9 % bolus 500 mL (0 mLs Intravenous Stopped 10/02/16 0123)  morphine 4 MG/ML injection 4 mg (4 mg Intravenous Given 10/02/16 0001)  ondansetron (ZOFRAN) injection 4 mg (4 mg Intravenous  Given 10/02/16 0001)  lidocaine (PF) (XYLOCAINE) 1 % injection 10 mL (10 mLs Intradermal Given 10/02/16 0123)  iopamidol (ISOVUE-300) 61 % injection (100 mLs  Contrast Given 10/02/16 0055)  morphine 4 MG/ML injection 4 mg (4 mg Intravenous Given 10/02/16 0123)     Initial Impression / Assessment and Plan / ED Course  I have reviewed the triage vital signs and the nursing notes.  Pertinent labs & imaging results that were available during my care of the patient were reviewed by me and considered in my medical decision making (see chart for details).    This is a 66 y.o. Male who presents to the ED after a fall off a ladder prior to arrival. Patient repots he was up on a ladder about 15 feet in a field trimming trees when he fell onto the ground hitting his back. He denies hitting his head or LOC. He reports pain to his mid to lower back that radiates around into his chest. He denies shortness of breath. He also reports developing some mild neck pain diffusely. He also complains of pain to his left hand and wrist as he braced his fall with his hand. He received fentanyl and Percocet in the emergency department with some relief of his pain. He ambulated after the fall prior to arrival to the emergency department.  On examination patient is afebrile nontoxic appearing. He has no midline neck or back tenderness to palpation. No crepitus or deformity. He has no focal neurological deficits. He has deformity and probable dislocation at his PIP joint of his left third and fourth fingers. No open wounds.  CT head and neck without contrast showed no acute findings. X-ray of his left hand shows dislocations at his PIP joint  of his left third and fourth fingers. Is also possible nondisplaced fracture through his triquetrum of his left wrist. Both finger joints were reduced by myself and taught her the well by the patient. Will obtain repeat x-ray to confirm.  CT chest and abdomen and pelvis with contrast shows an  oblique fracture of the posterior left eighth rib without depression. There is also an oblique fracture of the anterior superior endplate of the L2 vertebral body without compression.  At shift change patient is awaiting repeat x-ray of his left hand as well as consult to neurosurgery. Dr. Manus Gunning plans to speak to neurosurgery about his fracture. James Drape, PA-C will follow up after repeat hand x-ray. Plan is likely for discharge after neurosurgery consult and if patient can ambulate.    Final Clinical Impressions(s) / ED Diagnoses   Final diagnoses:  Fall from ladder, initial encounter  Closed dislocation of interphalangeal joint of left middle finger  Closed dislocation of interphalangeal joint of left ring finger  Closed nondisplaced fracture of triquetrum of left wrist, initial encounter  Acute bilateral low back pain without sciatica  Neck pain  Closed fracture of one rib of left side, initial encounter  Other closed fracture of second lumbar vertebra, initial encounter [facility])    New Prescriptions New Prescriptions   No medications on file     Everlene Farrier, Cordelia Poche 10/02/16 0205    Glynn Octave, MD 10/02/16 [phone]

## 2016-10-01 NOTE — ED Triage Notes (Signed)
Patient presents with severe back pain after falling 78ft off ladder

## 2016-10-02 ENCOUNTER — Other Ambulatory Visit: Payer: Self-pay

## 2016-10-02 ENCOUNTER — Emergency Department (HOSPITAL_COMMUNITY): Payer: 59

## 2016-10-02 LAB — I-STAT CHEM 8, ED
BUN: 27 mg/dL — ABNORMAL HIGH (ref 6–20)
CHLORIDE: 109 mmol/L (ref 101–111)
Calcium, Ion: 1.14 mmol/L — ABNORMAL LOW (ref 1.15–1.40)
Creatinine, Ser: 1.5 mg/dL — ABNORMAL HIGH (ref 0.61–1.24)
Glucose, Bld: 129 mg/dL — ABNORMAL HIGH (ref 65–99)
HEMATOCRIT: 41 % (ref 39.0–52.0)
Hemoglobin: 13.9 g/dL (ref 13.0–17.0)
Potassium: 3.9 mmol/L (ref 3.5–5.1)
SODIUM: 143 mmol/L (ref 135–145)
TCO2: 25 mmol/L (ref 22–32)

## 2016-10-02 LAB — CBC
HCT: 38.2 % — ABNORMAL LOW (ref 39.0–52.0)
HEMOGLOBIN: 12.6 g/dL — AB (ref 13.0–17.0)
MCH: 21.9 pg — ABNORMAL LOW (ref 26.0–34.0)
MCHC: 33 g/dL (ref 30.0–36.0)
MCV: 66.3 fL — ABNORMAL LOW (ref 78.0–100.0)
PLATELETS: 192 10*3/uL (ref 150–400)
RBC: 5.76 MIL/uL (ref 4.22–5.81)
RDW: 15.6 % — AB (ref 11.5–15.5)
WBC: 14.8 10*3/uL — ABNORMAL HIGH (ref 4.0–10.5)

## 2016-10-02 LAB — URINALYSIS, ROUTINE W REFLEX MICROSCOPIC
Bilirubin Urine: NEGATIVE
GLUCOSE, UA: NEGATIVE mg/dL
Ketones, ur: NEGATIVE mg/dL
Leukocytes, UA: NEGATIVE
NITRITE: NEGATIVE
PH: 5 (ref 5.0–8.0)
Protein, ur: 100 mg/dL — AB
SPECIFIC GRAVITY, URINE: 1.024 (ref 1.005–1.030)

## 2016-10-02 LAB — COMPREHENSIVE METABOLIC PANEL
ALK PHOS: 78 U/L (ref 38–126)
ALT: 58 U/L (ref 17–63)
ANION GAP: 7 (ref 5–15)
AST: 122 U/L — AB (ref 15–41)
Albumin: 4.1 g/dL (ref 3.5–5.0)
BILIRUBIN TOTAL: 0.5 mg/dL (ref 0.3–1.2)
BUN: 25 mg/dL — AB (ref 6–20)
CALCIUM: 8.9 mg/dL (ref 8.9–10.3)
CO2: 23 mmol/L (ref 22–32)
CREATININE: 1.62 mg/dL — AB (ref 0.61–1.24)
Chloride: 109 mmol/L (ref 101–111)
GFR calc Af Amer: 49 mL/min — ABNORMAL LOW (ref >60)
GFR calc non Af Amer: 43 mL/min — ABNORMAL LOW (ref >60)
GLUCOSE: 128 mg/dL — AB (ref 65–99)
Potassium: 3.8 mmol/L (ref 3.5–5.1)
SODIUM: 139 mmol/L (ref 135–145)
TOTAL PROTEIN: 7.1 g/dL (ref 6.5–8.1)

## 2016-10-02 LAB — PROTIME-INR
INR: 0.97
Prothrombin Time: 12.8 seconds (ref 11.4–15.2)

## 2016-10-02 MED ORDER — HYDROMORPHONE HCL 1 MG/ML IJ SOLN
1.0000 mg | Freq: Once | INTRAMUSCULAR | Status: AC
  Administered 2016-10-02: 1 mg via INTRAVENOUS
  Filled 2016-10-02: qty 1

## 2016-10-02 MED ORDER — LISINOPRIL 40 MG PO TABS: 40.0000 mg | ORAL_TABLET | Freq: Every day | ORAL | 0 refills | Status: AC

## 2016-10-02 MED ORDER — IOPAMIDOL (ISOVUE-300) INJECTION 61%
INTRAVENOUS | Status: AC
  Administered 2016-10-02: 100 mL
  Filled 2016-10-02: qty 100

## 2016-10-02 MED ORDER — MORPHINE SULFATE (PF) 4 MG/ML IV SOLN
4.0000 mg | Freq: Once | INTRAVENOUS | Status: AC
  Administered 2016-10-02: 4 mg via INTRAVENOUS
  Filled 2016-10-02: qty 1

## 2016-10-02 MED ORDER — DIAZEPAM 5 MG/ML IJ SOLN
5.0000 mg | Freq: Once | INTRAMUSCULAR | Status: AC
  Administered 2016-10-02: 5 mg via INTRAVENOUS
  Filled 2016-10-02: qty 2

## 2016-10-02 MED ORDER — AMLODIPINE BESYLATE 10 MG PO TABS: 10.0000 mg | ORAL_TABLET | Freq: Every day | ORAL | 0 refills | Status: AC

## 2016-10-02 MED ORDER — HYDROCODONE-ACETAMINOPHEN 5-325 MG PO TABS: 1.0000 | ORAL_TABLET | Freq: Four times a day (QID) | ORAL | 0 refills | Status: DC | PRN

## 2016-10-02 MED ORDER — LIDOCAINE HCL (PF) 1 % IJ SOLN
10.0000 mL | Freq: Once | INTRAMUSCULAR | Status: AC
  Administered 2016-10-02: 10 mL via INTRADERMAL
  Filled 2016-10-02: qty 10

## 2016-10-02 MED ORDER — HYDRALAZINE HCL 50 MG PO TABS: 50.0000 mg | ORAL_TABLET | Freq: Every day | ORAL | 0 refills | Status: AC

## 2016-10-02 MED ORDER — SODIUM CHLORIDE 0.9 % IV BOLUS (SEPSIS)
500.0000 mL | Freq: Once | INTRAVENOUS | Status: AC
  Administered 2016-10-02: 500 mL via INTRAVENOUS

## 2016-10-02 MED ORDER — CYCLOBENZAPRINE HCL 10 MG PO TABS: 10.0000 mg | ORAL_TABLET | Freq: Two times a day (BID) | ORAL | 0 refills | Status: AC | PRN

## 2016-10-02 NOTE — ED Notes (Signed)
Patient placed on 2L O2 

## 2016-10-02 NOTE — ED Notes (Addendum)
Maximal effort to assist pt from lying to sitting to standing. Only able to take small, ginger steps towards room door. Movements stiff, tense, painful, hesitant throughout effort. SpO2 >95% throughout, with HR in the 80s. Only able to make it halfway to the door before pt desired to return to bed. Pt presently sitting at bedside. PA made aware.

## 2016-10-02 NOTE — NC FL2 (Signed)
  [place] MEDICAID FL2 LEVEL OF CARE SCREENING TOOL     IDENTIFICATION  Patient Name: James Odonnell Birthdate: 07/25/1950 Sex: male Admission Date (Current Location): 10/01/2016  [facility] and [place] Number:  Producer, television/film/video and Address:  [facility], [address], [place], [place] [address]      Provider Number: [id number]  Attending Physician Name and Address:  No att. providers found  Relative Name and Phone Number:       Current Level of Care: Hospital Recommended Level of Care: Skilled Nursing Facility Prior Approval Number:    Date Approved/Denied:   PASRR Number:   [id number]   Discharge Plan: SNF    Current Diagnoses: There are no active problems to display for this patient.   Orientation RESPIRATION BLADDER Height & Weight     Self, Time, Situation, Place  Normal Continent Weight: 235 lb (106.6 kg) Height:   (180.3 cm)  BEHAVIORAL SYMPTOMS/MOOD NEUROLOGICAL BOWEL NUTRITION STATUS      Continent Diet (please see discharge summary or AVS. )  AMBULATORY STATUS COMMUNICATION OF NEEDS Skin   Extensive Assist Verbally  (due to fall )                       Personal Care Assistance Level of Assistance  Bathing, Feeding Bathing Assistance: Maximum assistance Feeding assistance: Limited assistance Dressing Assistance: Maximum assistance     Functional Limitations Info  Sight, Hearing, Speech Sight Info: Adequate Hearing Info: Adequate Speech Info: Adequate    SPECIAL CARE FACTORS FREQUENCY  PT (By licensed PT), OT (By licensed OT)     PT Frequency: 5 times a week  OT Frequency: 5 times a week             Contractures Contractures Info: Not present    Additional Factors Info  Code Status, Allergies Code Status Info: unknown  (not on file. ) Allergies Info: NKA           Current Medications (10/02/2016):  This is the current hospital active medication list Current Facility-Administered Medications   Medication Dose Route Frequency Provider Last Rate Last Dose  . fentaNYL (SUBLIMAZE) injection 50 mcg  50 mcg Nasal Q20 Min PRN Tegeler, Canary Brim, MD      . oxyCODONE-acetaminophen (PERCOCET/ROXICET) 5-325 MG per tablet 1 tablet  1 tablet Oral Q30 min PRN Gerhard Munch, MD   1 tablet at 10/01/16 1859   Current Outpatient Prescriptions  Medication Sig Dispense Refill  . amLODipine (NORVASC) 10 MG tablet Take 10 mg by mouth daily.    . hydrALAZINE (APRESOLINE) 50 MG tablet Take 50 mg by mouth daily.    Marland Kitchen lisinopril (PRINIVIL,ZESTRIL) 40 MG tablet Take 40 mg by mouth daily.    . cyclobenzaprine (FLEXERIL) 10 MG tablet Take 1 tablet (10 mg total) by mouth 2 (two) times daily as needed for muscle spasms. 20 tablet 0  . HYDROcodone-acetaminophen (NORCO/VICODIN) 5-325 MG tablet Take 1-2 tablets by mouth every 6 (six) hours as needed. 12 tablet 0     Discharge Medications: Please see discharge summary for a list of discharge medications.  Relevant Imaging Results:  Relevant Lab Results:   Additional Information SSN- [id number]  Robb Matar, LCSWA

## 2016-10-02 NOTE — Clinical Social Work Note (Signed)
Clinical Social Work Assessment  Patient Details  Name: James Odonnell MRN: [id number] Date of Birth: 06-Jul-1950  Date of referral:  10/02/16               Reason for consult:  Facility Placement (for rehab)                Permission sought to share information with:  Case Manager Permission granted to share information::  Yes, Verbal Permission Granted  Name::     Reeves Forth  Agency::     Relationship::     Contact Information:     Housing/Transportation Living arrangements for the past 2 months:  Single Family Home (in [place].) Source of Information:  Patient Patient Interpreter Needed:  None Criminal Activity/Legal Involvement Pertinent to Current Situation/Hospitalization:  No - Comment as needed Significant Relationships:  Parents (pt's mother) Lives with:  Parents (down here visiting mom per pt. ) Do you feel safe going back to the place where you live?  Yes Need for family participation in patient care:  Yes (Comment)  Care giving concerns:  CSW and RN Case manager spoke with pt at bedside. Pt reports no concerns, but does inform both CSW and RN CM that pt is not from [place] but thinks pt will need rehab placement.    Social Worker assessment / plan:  CSW spoke with pt at bedside. During this time CSW was informed that pt is from [place] and was down in [place] visiting pt's mom as pt reports she needed help over the weekend. CSW and RN CM asked pt where pt had bee staying at while here and pt reports that pt had been at [employer] house and would like facility placement close to her ([place]). RN CM informed pt that CSW and RN CM would work on it, however PT would be to see pt first and would then move forward with their recommendation. Pt did ask that pt receive medication for blood pressure a pt has been taking three pills a day to manage blood pressure.   Employment status:  Retired Health and safety inspector:  Other (Comment Required) Advertising copywriter ) PT  Recommendations:  Not assessed at this time Information / Referral to community resources:  Skilled Nursing Facility  Patient/Family's Response to care:  Pt is understanding and agreeable to CSW and RN CM plan of care at this time.   Patient/Family's Understanding of and Emotional Response to Diagnosis, Current Treatment, and Prognosis:  NO further questions or concerns have been presented to CSW at this time.  Emotional Assessment Appearance:  Appears stated age Attitude/Demeanor/Rapport:    Affect (typically observed):  Pleasant Orientation:  Oriented to Self, Oriented to Place, Oriented to  Time, Oriented to Situation Alcohol / Substance use:  Not Applicable Psych involvement (Current and /or in the community):  No (Comment)  Discharge Needs  Concerns to be addressed:  No discharge needs identified Readmission within the last 30 days:  No Current discharge risk:  None Barriers to Discharge:  No Barriers Identified   Robb Matar, LCSWA 10/02/2016, 8:21 AM

## 2016-10-02 NOTE — Progress Notes (Signed)
Orthopedic Tech Progress Note Patient Details:  James Odonnell [birth year] [id number]  Ortho Devices Type of Ortho Device: Buddy tape, Finger splint, Velcro wrist splint Ortho Device/Splint Location: lue 3rd and 4th finger buddy tape and finger splint. lue velcro wrist splint. Ortho Device/Splint Interventions: Ordered, Application, Adjustment   Trinna Post 10/02/2016, 2:34 AM

## 2016-10-02 NOTE — Discharge Instructions (Signed)
Please follow-up with your doctor and the specialists listed.  Make an appointment for ASAP.  Take medications as prescribed.

## 2016-10-02 NOTE — Telephone Encounter (Signed)
RX faxed to [facility] @ [phone], phone number [phone] 

## 2016-10-02 NOTE — Discharge Planning (Signed)
Alysiah Suppa J. Lucretia Roers, RN, BSN, [employer] [phone] Spoke with pt at bedside regarding discharge planning for [facility]. Offered pt list of home health agencies to choose from.  Pt chose [facility] to render services. Samantha Crimes of [facility] notified. Patient made aware that [facility] will be in contact in 24-48 hours.  No DME needs identified at this time.

## 2016-10-02 NOTE — ED Provider Notes (Signed)
Having extreme difficulty walking due to pain.  May need placement for rehab vs home health.  Will consult PT/OT.  [place], PA-C to follow.   Roxy Horseman, PA-C 10/02/16 4401    Glynn Octave, MD 10/02/16 2139

## 2016-10-02 NOTE — ED Provider Notes (Signed)
Case manager/social work in to see pt. Pt has requested a rehab facility which his insurance does not cover. Pt has agreed to [facility], PT and OT. Orders placed. Pt safe for discharge   Dub Mikes, PA-C 10/02/16 1251    Eber Hong, MD 10/02/16 1409

## 2016-10-02 NOTE — Progress Notes (Signed)
PT Cancellation Note  Patient Details Name: James Odonnell MRN: [id number] DOB: 24-Apr-1950   Cancelled Treatment:    Reason Eval/Treat Not Completed: Patient not medically ready; noted pt with spinal fx and TLSO ordered.  Will await neurosurgery recommendations prior to mobilization.    Elray Mcgregor 10/02/2016, 9:05 AM  Sheran Lawless, PT [phone] 10/02/2016

## 2016-10-02 NOTE — ED Notes (Signed)
Tried to walk pt but pt could not even stand at the side of the bed. Pt in too much pain.

## 2016-10-02 NOTE — Progress Notes (Signed)
PT Cancellation Note  Patient Details Name: Kayson Tasker MRN: [id number] DOB: 22-Jun-1950   Cancelled Treatment:    Reason Eval/Treat Not Completed: Other (comment); spoke with SW who reports pt to be placed and currently no PT eval needed.  Will sign off.   Elray Mcgregor 10/02/2016, 3:56 PM  Sheran Lawless, PT [phone] 10/02/2016

## 2016-10-02 NOTE — ED Notes (Signed)
ED Provider at bedside. 

## 2016-10-02 NOTE — ED Notes (Signed)
Pt placed on 3L O2 via [facility]. SpO2 <90% on RA with pt sleeping.

## 2016-10-02 NOTE — Progress Notes (Addendum)
2:55pm- CSW spoke with Lurena Joiner  from [facility] and confirmed that they are able to take pt today. CSW updated RN and doctor at this time. RN to call in report and set up PTAR when pt is ready. No further CSW needs at this time.    CSW was informed by RN CM that pt is now seeking placement to a facility as pt realizes that mother can not care for pt while at home. CSW spoke with pt once again and pt was open to going to a facility here in [place]. CSW informed pt that pt had two other options aside from the one that CSW had spoken with pt about earlier in the day. Pt is wanting to go to [facility] and Rehab. CSW has sent over [facility] and was informed by Lurena Joiner that the facility does not need an LOG or PT orders. CSW has being contact with Lurena Joiner [phone]) about getting pt to the facility. Lurena Joiner intitially told CSW that pt could come to the facility at 3pm today, however CSW was asked to fax over the AVS and other needed information. CSW is now awaiting returned call from Lurena Joiner to see if pt can be dsicharged to the facility. CSW will continue to assist with discharge needs and hand off to evening CSW as needed.    Claude Manges Raysha Tilmon, MSW, LCSW-A Emergency Department Clinical Social Worker [phone]

## 2016-10-02 NOTE — Progress Notes (Addendum)
11:32am- CSW went to update doctor, PA, and RN of facility placement with pt. CSW consulted with all three parties about [facility] coming to see pt today and neither could confirm this. CSW spoke with pt at bedside and update him of what choices CSW had available  for pt at this. Pt chose to go home with [facility] and would work on going to [facility] once home. CSW update CM RN of this as well as other staff involved in care. At this time there are no other CSW intervention needed.   CSW has faxed pt out and awaiting responses at this time. Per Wandra Mannan, [facility] is willing to do an LOG for pt to be placed from the ED as pt insurance may take a few days for authorization. At this time CSW has reached out to [facility] at this time.     Claude Manges Amardeep Beckers, MSW, LCSW-A Emergency Department Clinical Social Worker [phone]

## 2016-10-03 ENCOUNTER — Encounter: Payer: Self-pay | Admitting: Adult Health

## 2016-10-03 ENCOUNTER — Non-Acute Institutional Stay (SKILLED_NURSING_FACILITY): Payer: 59 | Admitting: Adult Health

## 2016-10-03 DIAGNOSIS — S2232XD Fracture of one rib, left side, subsequent encounter for fracture with routine healing: Secondary | ICD-10-CM | POA: Diagnosis not present

## 2016-10-03 DIAGNOSIS — S62102D Fracture of unspecified carpal bone, left wrist, subsequent encounter for fracture with routine healing: Secondary | ICD-10-CM | POA: Diagnosis not present

## 2016-10-03 DIAGNOSIS — W11XXXD Fall on and from ladder, subsequent encounter: Secondary | ICD-10-CM

## 2016-10-03 DIAGNOSIS — S62102A Fracture of unspecified carpal bone, left wrist, initial encounter for closed fracture: Secondary | ICD-10-CM | POA: Insufficient documentation

## 2016-10-03 DIAGNOSIS — S63253D Unspecified dislocation of left middle finger, subsequent encounter: Secondary | ICD-10-CM | POA: Diagnosis not present

## 2016-10-03 DIAGNOSIS — M542 Cervicalgia: Secondary | ICD-10-CM | POA: Diagnosis not present

## 2016-10-03 DIAGNOSIS — S63255A Unspecified dislocation of left ring finger, initial encounter: Secondary | ICD-10-CM | POA: Diagnosis not present

## 2016-10-03 DIAGNOSIS — S2232XA Fracture of one rib, left side, initial encounter for closed fracture: Secondary | ICD-10-CM | POA: Insufficient documentation

## 2016-10-03 DIAGNOSIS — I10 Essential (primary) hypertension: Secondary | ICD-10-CM | POA: Insufficient documentation

## 2016-10-03 DIAGNOSIS — W11XXXA Fall on and from ladder, initial encounter: Secondary | ICD-10-CM | POA: Insufficient documentation

## 2016-10-03 DIAGNOSIS — S32029A Unspecified fracture of second lumbar vertebra, initial encounter for closed fracture: Secondary | ICD-10-CM | POA: Insufficient documentation

## 2016-10-03 DIAGNOSIS — S32020D Wedge compression fracture of second lumbar vertebra, subsequent encounter for fracture with routine healing: Secondary | ICD-10-CM

## 2016-10-03 DIAGNOSIS — S63253A Unspecified dislocation of left middle finger, initial encounter: Secondary | ICD-10-CM | POA: Insufficient documentation

## 2016-10-03 NOTE — Progress Notes (Signed)
Location:   [facility] Room Number: 119 A Place of Service:  SNF (31)   CODE STATUS:  Full Code  No Known Allergies  Chief Complaint  Patient presents with  . Hospitalization Follow-up    Hospital Follow up    HPI:  He is a 66 year old male who had a fall off his ladder. He was taken to the and found to fracture left wrist with #2-3 fingers; fractured right on left; second lumbar fracture acute back and neck pain. He is here for short term rehab. He is not having any pain; stating that his pain medication is effective. He denies any constipation; or insomnia. Nursing staff report that he has mild left ankle swelling.   Past Medical History:  Diagnosis Date  . Hypertension     History reviewed. No pertinent surgical history.  Social History   Social History  . Marital status: Married    Spouse name: N/A  . Number of children: N/A  . Years of education: N/A   Occupational History  . Not on file.   Social History Main Topics  . Smoking status: Never Smoker  . Smokeless tobacco: Never Used  . Alcohol use Not on file  . Drug use: Unknown  . Sexual activity: Not on file   Other Topics Concern  . Not on file   Social History Narrative  . No narrative on file   History reviewed. No pertinent family history.    VITAL SIGNS BP 136/76   Pulse 86   Temp 98.6 F (37 C)   Resp 18   Ht  (1.803 m)   Wt 235 lb (106.6 kg)   SpO2 95%   BMI 32.78 kg/m   Patient's Medications  New Prescriptions   No medications on file  Previous Medications   AMLODIPINE (NORVASC) 10 MG TABLET    Take 1 tablet (10 mg total) by mouth daily.   CYCLOBENZAPRINE (FLEXERIL) 10 MG TABLET    Take 1 tablet (10 mg total) by mouth 2 (two) times daily as needed for muscle spasms.   HYDRALAZINE (APRESOLINE) 50 MG TABLET    Take 1 tablet (50 mg total) by mouth daily.   HYDROCODONE-ACETAMINOPHEN (NORCO/VICODIN) 5-325 MG TABLET    Take 1-2 tablets by mouth every 6 (six) hours as  needed.   LISINOPRIL (PRINIVIL,ZESTRIL) 40 MG TABLET    Take 1 tablet (40 mg total) by mouth daily.  Modified Medications   No medications on file  Discontinued Medications   No medications on file     SIGNIFICANT DIAGNOSTIC EXAMS  TODAY:   10-01-16: left wrist x-ray:Possible nondisplaced fracture along the distal aspect of the triquetrum bone.  10-01-16: left hand x-ray: Dislocation of the proximal interphalangeal joints at the left third and fourth fingers.  10-01-16: chest x-ray: No active disease.  10-02-16: left hand x-ray: Successful interval reduction of the proximal interphalangeal joints of the left third and fourth fingers.  10-02-16: ct of head and cervical spine: 1. No acute intracranial abnormalities.  Mild diffuse atrophy. 2. Nonspecific reversal of the usual cervical lordosis. No acute displaced fractures identified in the cervical spine. Degenerative Changes.  10-02-16: ct of chest abdomen and pelvis: 1. Oblique fracture of the posterior left eighth rib without depression. 2. Bilateral lung base opacities may represent atelectasis or small contusions. 3. No evidence of mediastinal or aortic injury. 4. Oblique fracture of the anterior superior endplate of the L2 vertebral body without compression. 5. No evidence of solid  organ injury or bowel perforation.  LABS REVIEWED: TODAY:   10-02-16: wbc 14.8; hgb 12.6; hct 382. mcv 66.3; plt 192; glucose 128; bun 25; creat 1.62; k+ 38; na++ 139; ca 8.9; ast 122; albumin 4.1   Review of Systems  Constitutional: Negative for malaise/fatigue.  Respiratory: Negative for cough and shortness of breath.   Cardiovascular: Negative for chest pain, palpitations and leg swelling.  Gastrointestinal: Negative for abdominal pain, constipation and heartburn.  Musculoskeletal: Negative for back pain, joint pain and myalgias.       Pain medications as effective   Skin: Negative.   Neurological: Negative for dizziness.  Psychiatric/Behavioral:  The patient is not nervous/anxious.      Physical Exam  Constitutional: He is oriented to person, place, and time. No distress.  Obese   Eyes: Conjunctivae are normal.  Neck: Neck supple. No JVD present. No thyromegaly present.  Cardiovascular: Normal rate, regular rhythm and intact distal pulses.   Respiratory: Effort normal and breath sounds normal. No respiratory distress. He has no wheezes.  GI: Soft. Bowel sounds are normal. He exhibits no distension. There is no tenderness.  Abdomen is rounded   Musculoskeletal: He exhibits no edema.  Able to move all extremities  Has left wrist and third finger in splint Has shell back splint to use when OOB Mild left ankle swelling non-tend to palpation and has full range of motion.   Lymphadenopathy:    He has no cervical adenopathy.  Neurological: He is alert and oriented to person, place, and time.  Skin: Skin is warm and dry. He is not diaphoretic.  Psychiatric: He has a normal mood and affect.    ASSESSMENT/ PLAN:  TODAY:  1. Hypertension: stable b/p: 136/76: will continue: lisinopril 40 mg daily apresoline 50 mg daily and norvasc 10 mg daily   2. Status post fall off ladder: multiple fractures; including lumbar fracture; rib fracture; left wrist and finer fracture and rib fracture: will continue therapy as directed; will follow up with ortho as ordered; will continue vicodin 5/325 mg 1 or 2 tabs every 6 hours as needed   Time spent with patient 40 minutes: educated patient on therapy; expected outcomes; educated on side effects of pain medication and discharge planning: verbalized understanding.     MD is aware of resident's narcotic use and is in agreement with current plan of care. We will attempt to wean resident as apropriate   Synthia Innocent NP [facility]  Contact [phone] Monday through Friday 8am- 5pm  After hours call [phone]

## 2016-10-07 ENCOUNTER — Encounter: Payer: Self-pay | Admitting: Internal Medicine

## 2016-10-07 ENCOUNTER — Non-Acute Institutional Stay (SKILLED_NURSING_FACILITY): Payer: 59 | Admitting: Internal Medicine

## 2016-10-07 DIAGNOSIS — K5903 Drug induced constipation: Secondary | ICD-10-CM

## 2016-10-07 DIAGNOSIS — S2232XD Fracture of one rib, left side, subsequent encounter for fracture with routine healing: Secondary | ICD-10-CM | POA: Diagnosis not present

## 2016-10-07 DIAGNOSIS — S62102D Fracture of unspecified carpal bone, left wrist, subsequent encounter for fracture with routine healing: Secondary | ICD-10-CM

## 2016-10-07 DIAGNOSIS — M62838 Other muscle spasm: Secondary | ICD-10-CM | POA: Diagnosis not present

## 2016-10-07 DIAGNOSIS — S32028A Other fracture of second lumbar vertebra, initial encounter for closed fracture: Secondary | ICD-10-CM

## 2016-10-07 NOTE — Progress Notes (Signed)
Patient ID: James Odonnell, male   DOB: 1950-11-22, 66 y.o.   MRN: [id number]     HISTORY AND PHYSICAL   DATE:   October 07, 2016  Location:   [facility] Room Number: 119 A Place of Service: SNF (31)   Extended Emergency Contact Information Primary Emergency Contact: [place] Phone: [phone] Relation: Spouse Secondary Emergency Contact: Boissonneault,Bettie  [place] Phone: [phone] Relation: Mother  Advanced Directive information Does Patient Have a Medical Advance Directive?: No, Would patient like information on creating a medical advance directive?: No - Patient declined  Chief Complaint  Patient presents with  . New Admit To SNF    Admission    HPI:  66 yo male seen today as a new admission into SNF following ED visit for fall from ladder, L2 fx and dislocation of fingers. He fell on 10/01/16 from ladder 15 ft onto his back. Imaging revealed following - CT head/neck no acute process; xray left hand showed dislocation at PIP joint 3rd and 4th digits and possible nondisplaced fx through triquetrum of wrist; CT chest/abdomen showed (+) oblique fx at posterior left 8th rib without depression/oblique fx at L2 without compression. His finger dislocations were reduced by ED provider. Hgb 12.6; WBC 14.8K; Cr 1.62; BUN 25; AST 122. He presents to SNF for short term rehab.  Today he reports constipation since SNF arrival last week. Pain is 5/10 on scale and worse "at 1st movement". He noticed a bruise on left lateral ankle and had an xray. He has seen the facility rehab provider. He is scheduled to see Ortho some time this week to f/u on L2 fx, left rib fx and left wrist fx. He is wearing a back brace. No loss of bowel/bladder control. No numbness/tingling. His goal is to return home to [facility] and complete rehab.  HTN - stable on hydralazine, lisinopril and amlodipine  Past Medical History:  Diagnosis Date  .  Hypertension     History reviewed. No pertinent surgical history.  Patient Care Team: System, Pcp Not In as PCP - General  Social History   Social History  . Marital status: Married    Spouse name: N/A  . Number of children: N/A  . Years of education: N/A   Occupational History  . Not on file.   Social History Main Topics  . Smoking status: Never Smoker  . Smokeless tobacco: Never Used  . Alcohol use Not on file  . Drug use: Unknown  . Sexual activity: Not on file   Other Topics Concern  . Not on file   Social History Narrative  . No narrative on file     reports that he has never smoked. He has never used smokeless tobacco. His alcohol and drug histories are not on file.  History reviewed. No pertinent family history. No family status information on file.    Immunization History  Administered Date(s) Administered  . PPD Test 10/03/2016    No Known Allergies  Medications: Patient's Medications  New Prescriptions   No medications on file  Previous Medications   AMLODIPINE (NORVASC) 10 MG TABLET    Take 1 tablet (10 mg total) by mouth daily.   CYCLOBENZAPRINE (FLEXERIL) 10 MG TABLET    Take 1 tablet (10 mg total) by mouth 2 (two) times daily as needed for muscle spasms.   HYDRALAZINE (APRESOLINE) 50 MG TABLET    Take 1 tablet (50 mg total) by mouth daily.  HYDROCODONE-ACETAMINOPHEN (NORCO/VICODIN) 5-325 MG TABLET    Take 1-2 tablets by mouth every 6 (six) hours as needed.   LISINOPRIL (PRINIVIL,ZESTRIL) 40 MG TABLET    Take 1 tablet (40 mg total) by mouth daily.  Modified Medications   No medications on file  Discontinued Medications   No medications on file    Review of Systems  Gastrointestinal: Positive for constipation.  Musculoskeletal: Positive for back pain and gait problem.  All other systems reviewed and are negative.   Vitals:   10/07/16 0750  BP: (!) 168/90  Pulse: 86  Resp: 18  Temp: 98.6 F (37 C)  SpO2: 95%  Weight: 235 lb  (106.6 kg)  Height: '5\' 11"'$  (1.803 m)   Body mass index is 32.78 kg/m.  Physical Exam  Constitutional: He is oriented to person, place, and time. He appears well-developed and well-nourished.  Sitting in chair at bedside in NAD; back brace intact  HENT:  Mouth/Throat: Oropharynx is clear and moist.  MMM; no oral thrush  Eyes: Pupils are equal, round, and reactive to light. No scleral icterus.  Neck: Neck supple. Carotid bruit is not present. No thyromegaly present.  Cardiovascular: Normal rate, regular rhythm, normal heart sounds and intact distal pulses.  Exam reveals no gallop and no friction rub.   No murmur heard. Trace LLE edema with no calf TTP; no RLE edema and no right calf TTP  Pulmonary/Chest: Effort normal and breath sounds normal. He has no wheezes. He has no rales. He exhibits no tenderness.  Abdominal: Soft. Normal appearance and bowel sounds are normal. He exhibits distension. He exhibits no abdominal bruit, no pulsatile midline mass and no mass. There is no hepatomegaly. There is no tenderness. There is no rigidity, no rebound and no guarding. No hernia.  Musculoskeletal: He exhibits edema and tenderness (L2 spinous process; left posterior rib 8).  Lymphadenopathy:    He has no cervical adenopathy.  Neurological: He is alert and oriented to person, place, and time.  Skin: Skin is warm and dry. No rash noted.  Contusion left lateral distal leg; no skin break  Psychiatric: He has a normal mood and affect. His behavior is normal. Judgment and thought content normal.     Labs reviewed: Admission on 10/01/2016, Discharged on 10/02/2016  Component Date Value Ref Range Status  . Sodium 10/02/2016 139  135 - 145 mmol/L Final  . Potassium 10/02/2016 3.8  3.5 - 5.1 mmol/L Final  . Chloride 10/02/2016 109  101 - 111 mmol/L Final  . CO2 10/02/2016 23  22 - 32 mmol/L Final  . Glucose, Bld 10/02/2016 128* 65 - 99 mg/dL Final  . BUN 10/02/2016 25* 6 - 20 mg/dL Final  .  Creatinine, Ser 10/02/2016 1.62* 0.61 - 1.24 mg/dL Final  . Calcium 10/02/2016 8.9  8.9 - 10.3 mg/dL Final  . Total Protein 10/02/2016 7.1  6.5 - 8.1 g/dL Final  . Albumin 10/02/2016 4.1  3.5 - 5.0 g/dL Final  . AST 10/02/2016 122* 15 - 41 U/L Final  . ALT 10/02/2016 58  17 - 63 U/L Final  . Alkaline Phosphatase 10/02/2016 78  38 - 126 U/L Final  . Total Bilirubin 10/02/2016 0.5  0.3 - 1.2 mg/dL Final  . GFR calc non Af Amer 10/02/2016 43* >60 mL/min Final  . GFR calc Af Amer 10/02/2016 49* >60 mL/min Final   Comment: (NOTE) The eGFR has been calculated using the CKD EPI equation. This calculation has not been validated in all clinical situations.  eGFR's persistently <60 mL/min signify possible Chronic Kidney Disease.   . Anion gap 10/02/2016 7  5 - 15 Final  . WBC 10/02/2016 14.8* 4.0 - 10.5 K/uL Final  . RBC 10/02/2016 5.76  4.22 - 5.81 MIL/uL Final  . Hemoglobin 10/02/2016 12.6* 13.0 - 17.0 g/dL Final  . HCT 10/02/2016 38.2* 39.0 - 52.0 % Final  . MCV 10/02/2016 66.3* 78.0 - 100.0 fL Final  . MCH 10/02/2016 21.9* 26.0 - 34.0 pg Final  . MCHC 10/02/2016 33.0  30.0 - 36.0 g/dL Final  . RDW 10/02/2016 15.6* 11.5 - 15.5 % Final  . Platelets 10/02/2016 192  150 - 400 K/uL Final  . Color, Urine 10/02/2016 YELLOW  YELLOW Final  . APPearance 10/02/2016 HAZY* CLEAR Final  . Specific Gravity, Urine 10/02/2016 1.024  1.005 - 1.030 Final  . pH 10/02/2016 5.0  5.0 - 8.0 Final  . Glucose, UA 10/02/2016 NEGATIVE  NEGATIVE mg/dL Final  . Hgb urine dipstick 10/02/2016 LARGE* NEGATIVE Final  . Bilirubin Urine 10/02/2016 NEGATIVE  NEGATIVE Final  . Ketones, ur 10/02/2016 NEGATIVE  NEGATIVE mg/dL Final  . Protein, ur 10/02/2016 100* NEGATIVE mg/dL Final  . Nitrite 10/02/2016 NEGATIVE  NEGATIVE Final  . Leukocytes, UA 10/02/2016 NEGATIVE  NEGATIVE Final  . RBC / HPF 10/02/2016 0-5  0 - 5 RBC/hpf Final  . WBC, UA 10/02/2016 0-5  0 - 5 WBC/hpf Final  . Bacteria, UA 10/02/2016 RARE* NONE SEEN Final   . Squamous Epithelial / LPF 10/02/2016 0-5* NONE SEEN Final  . Mucus 10/02/2016 PRESENT   Final  . Hyaline Casts, UA 10/02/2016 PRESENT   Final  . Prothrombin Time 10/02/2016 12.8  11.4 - 15.2 seconds Final  . INR 10/02/2016 0.97   Final  . Sodium 10/02/2016 143  135 - 145 mmol/L Final  . Potassium 10/02/2016 3.9  3.5 - 5.1 mmol/L Final  . Chloride 10/02/2016 109  101 - 111 mmol/L Final  . BUN 10/02/2016 27* 6 - 20 mg/dL Final  . Creatinine, Ser 10/02/2016 1.50* 0.61 - 1.24 mg/dL Final  . Glucose, Bld 10/02/2016 129* 65 - 99 mg/dL Final  . Calcium, Ion 10/02/2016 1.14* 1.15 - 1.40 mmol/L Final  . TCO2 10/02/2016 25  22 - 32 mmol/L Final  . Hemoglobin 10/02/2016 13.9  13.0 - 17.0 g/dL Final  . HCT 10/02/2016 41.0  39.0 - 52.0 % Final    Dg Wrist Complete Left  Result Date: 10/01/2016 CLINICAL DATA:  15 foot fall from ladder.  Left wrist pain. EXAM: LEFT WRIST - COMPLETE 3+ VIEW COMPARISON:  None. FINDINGS: Tiny linear lucency along the distal aspect of the triquetrum bone may represent nondisplaced fracture. Mild dorsal soft tissue swelling. No other fracture or dislocation identified. No radiopaque soft tissue foreign bodies. No focal bone lesions. IMPRESSION: Possible nondisplaced fracture along the distal aspect of the triquetrum bone. Electronically Signed   By: Lucienne Capers M.D.   On: 10/01/2016 23:47   Ct Head Wo Contrast  Result Date: 10/02/2016 CLINICAL DATA:  Patient fell 15 feet from a ladder. History of hypertension. EXAM: CT HEAD WITHOUT CONTRAST CT CERVICAL SPINE WITHOUT CONTRAST TECHNIQUE: Multidetector CT imaging of the head and cervical spine was performed following the standard protocol without intravenous contrast. Multiplanar CT image reconstructions of the cervical spine were also generated. COMPARISON:  None. FINDINGS: CT HEAD FINDINGS Brain: Mild diffuse cerebral atrophy. No ventricular dilatation. No mass effect or midline shift. No abnormal extra-axial fluid  collections. Gray-white matter junctions are distinct.  Basal cisterns are not effaced. No acute intracranial hemorrhage. Vascular: No hyperdense vessel or unexpected calcification. Skull: Calvarium appears intact. No acute depressed fractures identified. Sinuses/Orbits: Mucosal thickening in the paranasal sinuses. No acute air-fluid levels. Mastoid air cells are not opacified. Other: None. CT CERVICAL SPINE FINDINGS Alignment: Reversal of the usual cervical lordosis without anterior subluxation. This is likely due to patient positioning but ligamentous injury or muscle spasm could also have this appearance and are not excluded. Normal alignment of the facet joints. C1-2 articulation appears intact. Skull base and vertebrae: No vertebral compression deformities. No focal bone lesion or bone destruction. 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 odontoid process. 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 Soft tissues and spinal canal: No prevertebral soft tissue swelling. No paraspinal infiltration. Disc levels: Degenerative changes throughout the cervical spine with narrowed interspaces and associated endplate hypertrophic changes. Changes are most prominent at C4-5 and C5-6. Prominent posterior disc osteophyte complex at C4-5 causes some effacement of the anterior thecal sac. No significant stenosis. Upper chest: Lung apices are clear. Other: None. IMPRESSION: 1. No acute intracranial abnormalities.  Mild diffuse atrophy. 2. Nonspecific reversal of the usual cervical lordosis. No acute displaced fractures identified in the cervical spine. Degenerative changes. Electronically Signed   By: Lucienne Capers M.D.   On: 10/02/2016 01:11   Ct Chest W Contrast  Result Date: 10/02/2016 CLINICAL DATA:  Patient fell 15 feet from a ladder. History of hypertension. EXAM: CT CHEST, ABDOMEN, AND PELVIS WITH CONTRAST TECHNIQUE: Multidetector CT  imaging of the chest, abdomen and pelvis was performed following the standard protocol during bolus administration of intravenous contrast. CONTRAST:  158m ISOVUE-300 IOPAMIDOL (ISOVUE-300) INJECTION 61% COMPARISON:  None. FINDINGS: CT CHEST FINDINGS Cardiovascular: Normal heart size. No pericardial effusion. Normal caliber thoracic aorta. Aorta and great vessel origins are patent. No evidence of dissection or aneurysm. Mediastinum/Nodes: No enlarged mediastinal, hilar, or axillary lymph nodes. Thyroid gland, trachea, and esophagus demonstrate no significant findings. Lungs/Pleura: Bilateral lung base opacities may represent atelectasis or small contusions. No pleural effusions. No pneumothorax. Airways are patent. Musculoskeletal: Normal alignment of the thoracic spine. Diffuse degenerative changes. No vertebral compression deformities. No sternal depression. Oblique fracture of the posterior left eighth rib without significant depression. Small adjacent paraspinal hematoma. CT ABDOMEN PELVIS FINDINGS Hepatobiliary: No hepatic injury or perihepatic hematoma. Gallbladder is unremarkable Pancreas: Unremarkable. No pancreatic ductal dilatation or surrounding inflammatory changes. Spleen: No splenic injury or perisplenic hematoma. Adrenals/Urinary Tract: No adrenal hemorrhage or renal injury identified. Bladder is unremarkable. Stomach/Bowel: Stomach is within normal limits. Appendix appears normal. No evidence of bowel wall thickening, distention, or inflammatory changes. Vascular/Lymphatic: No significant vascular findings are present. No enlarged abdominal or pelvic lymph nodes. Reproductive: Prostate is unremarkable. Other: No free air or free fluid in the abdomen. Abdominal wall musculature appears intact. Infiltration in the subcutaneous fat lateral to the left hip consistent with contusion/hematoma. Musculoskeletal: Normal alignment of the lumbar spine. Oblique fracture at the anterior superior endplate of the  L2 vertebral body. No displacement or retropulsion of fracture fragments. No significant vertebral compression. Pelvis, sacrum, and hips appear intact. Tiny ossicle posterior to the left ischium likely represents an old ununited ossicle. Can't entirely exclude a small avulsion fragment. IMPRESSION: 1. Oblique fracture of the posterior left eighth rib without depression. 2. Bilateral lung base opacities may represent atelectasis or small contusions. 3. No evidence of mediastinal or aortic injury. 4. Oblique  fracture of the anterior superior endplate of the L2 vertebral body without compression. 5. No evidence of solid organ injury or bowel perforation. Electronically Signed   By: Lucienne Capers M.D.   On: 10/02/2016 01:42   Ct Cervical Spine Wo Contrast  Result Date: 10/02/2016 CLINICAL DATA:  Patient fell 15 feet from a ladder. History of hypertension. EXAM: CT HEAD WITHOUT CONTRAST CT CERVICAL SPINE WITHOUT CONTRAST TECHNIQUE: Multidetector CT imaging of the head and cervical spine was performed following the standard protocol without intravenous contrast. Multiplanar CT image reconstructions of the cervical spine were also generated. COMPARISON:  None. FINDINGS: CT HEAD FINDINGS Brain: Mild diffuse cerebral atrophy. No ventricular dilatation. No mass effect or midline shift. No abnormal extra-axial fluid collections. Gray-white matter junctions are distinct. Basal cisterns are not effaced. No acute intracranial hemorrhage. Vascular: No hyperdense vessel or unexpected calcification. Skull: Calvarium appears intact. No acute depressed fractures identified. Sinuses/Orbits: Mucosal thickening in the paranasal sinuses. No acute air-fluid levels. Mastoid air cells are not opacified. Other: None. CT CERVICAL SPINE FINDINGS Alignment: Reversal of the usual cervical lordosis without anterior subluxation. This is likely due to patient positioning but ligamentous injury or muscle spasm could also have this appearance  and are not excluded. Normal alignment of the facet joints. C1-2 articulation appears intact. Skull base and vertebrae: No vertebral compression deformities. No focal bone lesion or bone destruction. 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 odontoid process. 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 Soft tissues and spinal canal: No prevertebral soft tissue swelling. No paraspinal infiltration. Disc levels: Degenerative changes throughout the cervical spine with narrowed interspaces and associated endplate hypertrophic changes. Changes are most prominent at C4-5 and C5-6. Prominent posterior disc osteophyte complex at C4-5 causes some effacement of the anterior thecal sac. No significant stenosis. Upper chest: Lung apices are clear. Other: None. IMPRESSION: 1. No acute intracranial abnormalities.  Mild diffuse atrophy. 2. Nonspecific reversal of the usual cervical lordosis. No acute displaced fractures identified in the cervical spine. Degenerative changes. Electronically Signed   By: Lucienne Capers M.D.   On: 10/02/2016 01:11   Ct Abdomen Pelvis W Contrast  Result Date: 10/02/2016 CLINICAL DATA:  Patient fell 15 feet from a ladder. History of hypertension. EXAM: CT CHEST, ABDOMEN, AND PELVIS WITH CONTRAST TECHNIQUE: Multidetector CT imaging of the chest, abdomen and pelvis was performed following the standard protocol during bolus administration of intravenous contrast. CONTRAST:  175m ISOVUE-300 IOPAMIDOL (ISOVUE-300) INJECTION 61% COMPARISON:  None. FINDINGS: CT CHEST FINDINGS Cardiovascular: Normal heart size. No pericardial effusion. Normal caliber thoracic aorta. Aorta and great vessel origins are patent. No evidence of dissection or aneurysm. Mediastinum/Nodes: No enlarged mediastinal, hilar, or axillary lymph nodes. Thyroid gland, trachea, and esophagus demonstrate no significant findings. Lungs/Pleura: Bilateral lung base  opacities may represent atelectasis or small contusions. No pleural effusions. No pneumothorax. Airways are patent. Musculoskeletal: Normal alignment of the thoracic spine. Diffuse degenerative changes. No vertebral compression deformities. No sternal depression. Oblique fracture of the posterior left eighth rib without significant depression. Small adjacent paraspinal hematoma. CT ABDOMEN PELVIS FINDINGS Hepatobiliary: No hepatic injury or perihepatic hematoma. Gallbladder is unremarkable Pancreas: Unremarkable. No pancreatic ductal dilatation or surrounding inflammatory changes. Spleen: No splenic injury or perisplenic hematoma. Adrenals/Urinary Tract: No adrenal hemorrhage or renal injury identified. Bladder is unremarkable. Stomach/Bowel: Stomach is within normal limits. Appendix appears normal. No evidence of bowel wall thickening, distention, or inflammatory changes. Vascular/Lymphatic: No  significant vascular findings are present. No enlarged abdominal or pelvic lymph nodes. Reproductive: Prostate is unremarkable. Other: No free air or free fluid in the abdomen. Abdominal wall musculature appears intact. Infiltration in the subcutaneous fat lateral to the left hip consistent with contusion/hematoma. Musculoskeletal: Normal alignment of the lumbar spine. Oblique fracture at the anterior superior endplate of the L2 vertebral body. No displacement or retropulsion of fracture fragments. No significant vertebral compression. Pelvis, sacrum, and hips appear intact. Tiny ossicle posterior to the left ischium likely represents an old ununited ossicle. Can't entirely exclude a small avulsion fragment. IMPRESSION: 1. Oblique fracture of the posterior left eighth rib without depression. 2. Bilateral lung base opacities may represent atelectasis or small contusions. 3. No evidence of mediastinal or aortic injury. 4. Oblique fracture of the anterior superior endplate of the L2 vertebral body without compression. 5. No  evidence of solid organ injury or bowel perforation. Electronically Signed   By: Burman Nieves M.D.   On: 10/02/2016 01:42   Dg Chest Port 1 View  Result Date: 10/01/2016 CLINICAL DATA:  Fall from ladder 15 feet a couple of hours ago. Back pain radiating to the chest. EXAM: PORTABLE CHEST 1 VIEW COMPARISON:  None. FINDINGS: Shallow inspiration. Normal heart size and pulmonary vascularity. No focal airspace disease or consolidation in the lungs. No blunting of costophrenic angles. No pneumothorax. Mediastinal contours appear intact. IMPRESSION: No active disease. Electronically Signed   By: Burman Nieves M.D.   On: 10/01/2016 23:44   Dg Hand Complete Left  Result Date: 10/02/2016 CLINICAL DATA:  Postreduction left third and fourth PIP joints. EXAM: LEFT HAND - COMPLETE 3+ VIEW COMPARISON:  10/01/2016 FINDINGS: Normal anatomic position of the proximal interphalangeal joints of the left third and fourth fingers postreduction. No acute fractures identified. Mild soft tissue swelling of the third and fourth fingers. IMPRESSION: Successful interval reduction of the proximal interphalangeal joints of the left third and fourth fingers. Electronically Signed   By: Burman Nieves M.D.   On: 10/02/2016 02:11   Dg Hand Complete Left  Result Date: 10/01/2016 CLINICAL DATA:  15 foot fall from ladder a couple of hours ago. Left hand pain. EXAM: LEFT HAND - COMPLETE 3+ VIEW COMPARISON:  None. FINDINGS: This is a limited single view study. A lateral view would help for confirmation, but there appears to be dislocation of the proximal interphalangeal joints at the third and fourth fingers. Chronic degenerative changes in the distal interphalangeal joints. Soft tissues are unremarkable. IMPRESSION: Dislocation of the proximal interphalangeal joints at the left third and fourth fingers. Electronically Signed   By: Burman Nieves M.D.   On: 10/01/2016 23:45     Assessment/Plan   ICD-10-CM   1. Drug-induced  constipation K59.03   2. Muscle spasm M62.838    neck, back and LE  3. Other closed fracture of second lumbar vertebra, initial encounter (HCC) S32.028A    no compression  4. Closed fracture of one rib of left side with routine healing, subsequent encounter S22.32XD    oblique fx of posterior rib 8  5. Closed fracture of left wrist with routine healing, subsequent encounter S62.102D     Start Senna 2 tabs po daily for constipation - hold for diarrhea  Cont other meds as ordered  PT/OT as ordered  Cont back brace as ordered  Apply cool compress qshift to left distal leg/ankle for  x 3 days  GOAL: short term rehab and d/c home when medically appropriate. Communicated with pt  and nursing.  Will follow  Sion Reinders S. Perlie Gold  [facility] [address] [place], [place] [address] [phone] Cell (Monday-Friday 8 AM - 5 PM) [phone] After 5 PM and follow prompts

## 2016-10-09 ENCOUNTER — Other Ambulatory Visit: Payer: Self-pay

## 2016-10-09 ENCOUNTER — Encounter: Payer: Self-pay | Admitting: Adult Health

## 2016-10-09 ENCOUNTER — Non-Acute Institutional Stay (SKILLED_NURSING_FACILITY): Payer: 59 | Admitting: Adult Health

## 2016-10-09 DIAGNOSIS — S32020D Wedge compression fracture of second lumbar vertebra, subsequent encounter for fracture with routine healing: Secondary | ICD-10-CM

## 2016-10-09 DIAGNOSIS — W11XXXD Fall on and from ladder, subsequent encounter: Secondary | ICD-10-CM | POA: Diagnosis not present

## 2016-10-09 DIAGNOSIS — I10 Essential (primary) hypertension: Secondary | ICD-10-CM

## 2016-10-09 MED ORDER — HYDROCODONE-ACETAMINOPHEN 5-325 MG PO TABS: 1.0000 | ORAL_TABLET | Freq: Four times a day (QID) | ORAL | 0 refills | Status: AC | PRN

## 2016-10-09 NOTE — Telephone Encounter (Signed)
D/C with patient

## 2016-10-09 NOTE — Progress Notes (Signed)
Location:   [facility] Room Number: 119 A Place of Service:  SNF (31)    CODE STATUS: Full Code  No Known Allergies  Chief Complaint  Patient presents with  . Discharge Note    Discharging to Home    HPI:  He is being discharged to home with home health for pt. He will need a 3:1 commode and a cane. He will need his prescriptions written and will need to follow up with his medical provider.  He had been hospitalized after a fall from a ladder. He was admitted to this facility for short term rehab and now ready to return home. He is not voicing any complaints of back pain; headaches; or neck pain. There are no nursing concerns at this time.    Past Medical History:  Diagnosis Date  . Hypertension     History reviewed. No pertinent surgical history.  Social History   Social History  . Marital status: Married    Spouse name: N/A  . Number of children: N/A  . Years of education: N/A   Occupational History  . Not on file.   Social History Main Topics  . Smoking status: Never Smoker  . Smokeless tobacco: Never Used  . Alcohol use Not on file  . Drug use: Unknown  . Sexual activity: Not on file   Other Topics Concern  . Not on file   Social History Narrative  . No narrative on file   History reviewed. No pertinent family history.  VITAL SIGNS BP 136/82   Pulse 74   Temp 98.6 F (37 C)   Resp 18   Ht  (1.803 m)   Wt 249 lb (112.9 kg)   SpO2 95%   BMI 34.73 kg/m    Patient's Medications  New Prescriptions   No medications on file  Previous Medications   AMLODIPINE (NORVASC) 10 MG TABLET    Take 1 tablet (10 mg total) by mouth daily.   CYCLOBENZAPRINE (FLEXERIL) 10 MG TABLET    Take 1 tablet (10 mg total) by mouth 2 (two) times daily as needed for muscle spasms.   HYDRALAZINE (APRESOLINE) 50 MG TABLET    Take 1 tablet (50 mg total) by mouth daily.   HYDROCODONE-ACETAMINOPHEN (NORCO/VICODIN) 5-325 MG TABLET    Take 1-2 tablets by  mouth every 6 (six) hours as needed.   LISINOPRIL (PRINIVIL,ZESTRIL) 40 MG TABLET    Take 1 tablet (40 mg total) by mouth daily.   SENNOSIDES PO    Take by mouth. Give 2 tablets by mouth daily.  Hold for diarrhea  Modified Medications   No medications on file  Discontinued Medications   No medications on file     SIGNIFICANT DIAGNOSTIC EXAMS  PREVIOUS:    10-01-16: left wrist x-ray:Possible nondisplaced fracture along the distal aspect of the triquetrum bone.  10-01-16: left hand x-ray: Dislocation of the proximal interphalangeal joints at the left third and fourth fingers.  10-01-16: chest x-ray: No active disease.  10-02-16: left hand x-ray: Successful interval reduction of the proximal interphalangeal joints of the left third and fourth fingers.  10-02-16: ct of head and cervical spine: 1. No acute intracranial abnormalities.  Mild diffuse atrophy. 2. Nonspecific reversal of the usual cervical lordosis. No acute displaced fractures identified in the cervical spine. Degenerative Changes.  10-02-16: ct of chest abdomen and pelvis: 1. Oblique fracture of the posterior left eighth rib without depression. 2. Bilateral lung base opacities may represent atelectasis or small  contusions. 3. No evidence of mediastinal or aortic injury. 4. Oblique fracture of the anterior superior endplate of the L2 vertebral body without compression. 5. No evidence of solid organ injury or bowel perforation.  NO NEW EXAMS   LABS REVIEWED: PREVIOUS:   10-02-16: wbc 14.8; hgb 12.6; hct 382. mcv 66.3; plt 192; glucose 128; bun 25; creat 1.62; k+ 38; na++ 139; ca 8.9; ast 122; albumin 4.1  NO NEW LABS    Review of Systems  Constitutional: Negative for malaise/fatigue.  Respiratory: Negative for cough and shortness of breath.   Cardiovascular: Negative for chest pain, palpitations and leg swelling.  Gastrointestinal: Negative for abdominal pain, constipation and heartburn.  Musculoskeletal: Negative for back  pain, joint pain and myalgias.  Skin: Negative.   Neurological: Negative for dizziness.  Psychiatric/Behavioral: The patient is not nervous/anxious.     Physical Exam  Constitutional: He is oriented to person, place, and time. He appears well-developed and well-nourished. No distress.  Obese   HENT:  Head: Normocephalic.  Eyes: Conjunctivae are normal.  Neck: Neck supple. No thyromegaly present.  Cardiovascular: Normal rate, regular rhythm, normal heart sounds and intact distal pulses.   Pulmonary/Chest: Effort normal and breath sounds normal. No respiratory distress.  Abdominal: Soft. Bowel sounds are normal. He exhibits no distension. There is no tenderness.  Musculoskeletal: He exhibits no edema.  Able to move all extremities  Has left wrist and third finger in splint Has shell back splint to use when OOB    Lymphadenopathy:    He has no cervical adenopathy.  Neurological: He is alert and oriented to person, place, and time.  Skin: Skin is warm and dry. He is not diaphoretic.  Psychiatric: He has a normal mood and affect.  Vitals reviewed.    ASSESSMENT/ PLAN:  Patient is being discharged with the following home health services:  Pt: to evaluate and treat as indicated for gait balance and strength   Patient is being discharged with the following durable medical equipment:  3:1 commode and cane   Patient has been advised to f/u with their PCP in 1-2 weeks to bring them up to date on their rehab stay.  Social services at facility was responsible for arranging this appointment.  Pt was provided with a 30 day supply of prescriptions for medications and refills must be obtained from their PCP.  For controlled substances, a more limited supply may be provided adequate until PCP appointment only.  A 30 day supply of non-narcotic medications written per the medication list as above  Narcotic prescriptions written as below:   #10 vicodin 5/325 mg tabs   40 minutes spent with  patient and the discharge process: including education about medications; home health orders; dme and expectations    Synthia Innocent NP [facility]  Contact [phone] Monday through Friday 8am- 5pm  After hours call [phone]

## 2016-10-23 DIAGNOSIS — S32029A Unspecified fracture of second lumbar vertebra, initial encounter for closed fracture: Secondary | ICD-10-CM | POA: Insufficient documentation

## 2016-10-23 DIAGNOSIS — K5903 Drug induced constipation: Secondary | ICD-10-CM | POA: Insufficient documentation

## 2016-10-23 DIAGNOSIS — M62838 Other muscle spasm: Secondary | ICD-10-CM | POA: Insufficient documentation

## 2016-10-23 DIAGNOSIS — S2232XD Fracture of one rib, left side, subsequent encounter for fracture with routine healing: Secondary | ICD-10-CM | POA: Insufficient documentation

## 2018-10-22 IMAGING — DX DG HAND COMPLETE 3+V*L*
1 series · 3 of 3 positions shown · non-contrast
Comparison: 10/01/2016

CLINICAL DATA: Postreduction left third and fourth PIP joints.

EXAM:
LEFT HAND - COMPLETE 3+ VIEW

[Series 1: hand · 0.14mm/px · 3 of 3 slices shown]
[im 1/3]
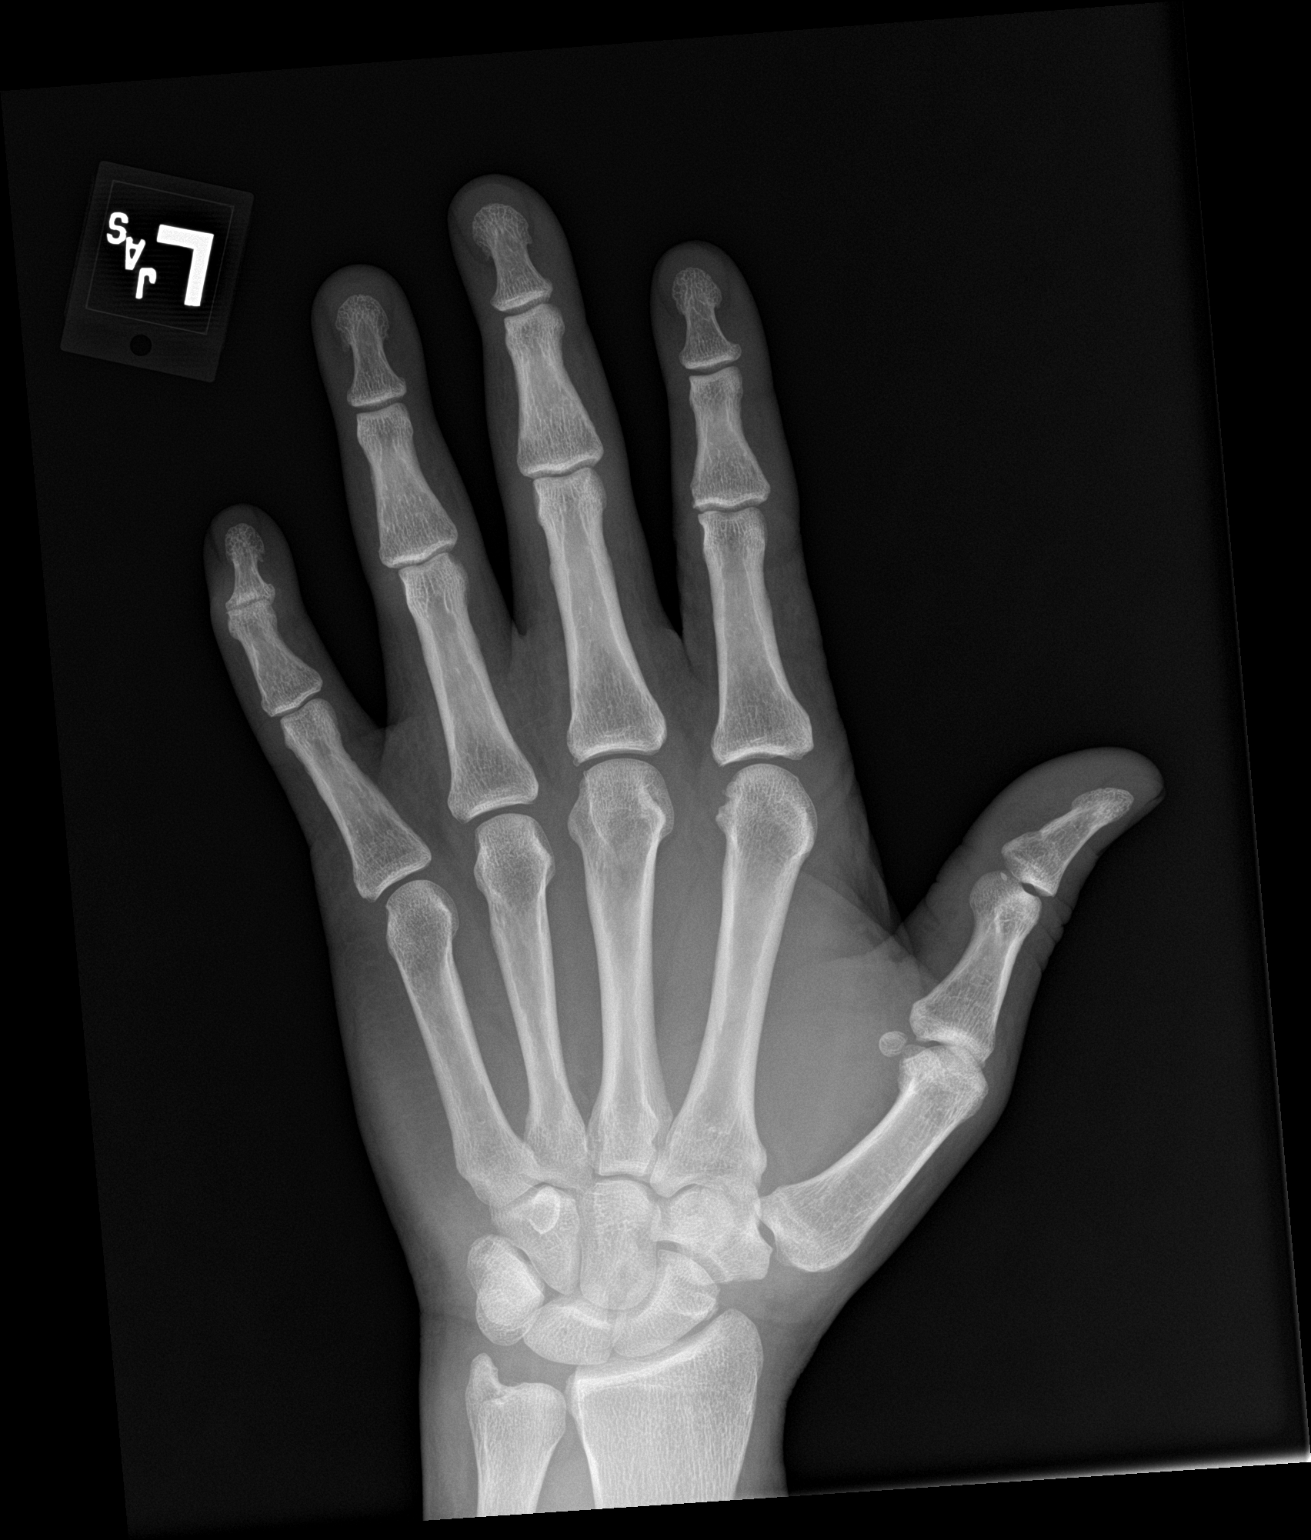
[im 2/3]
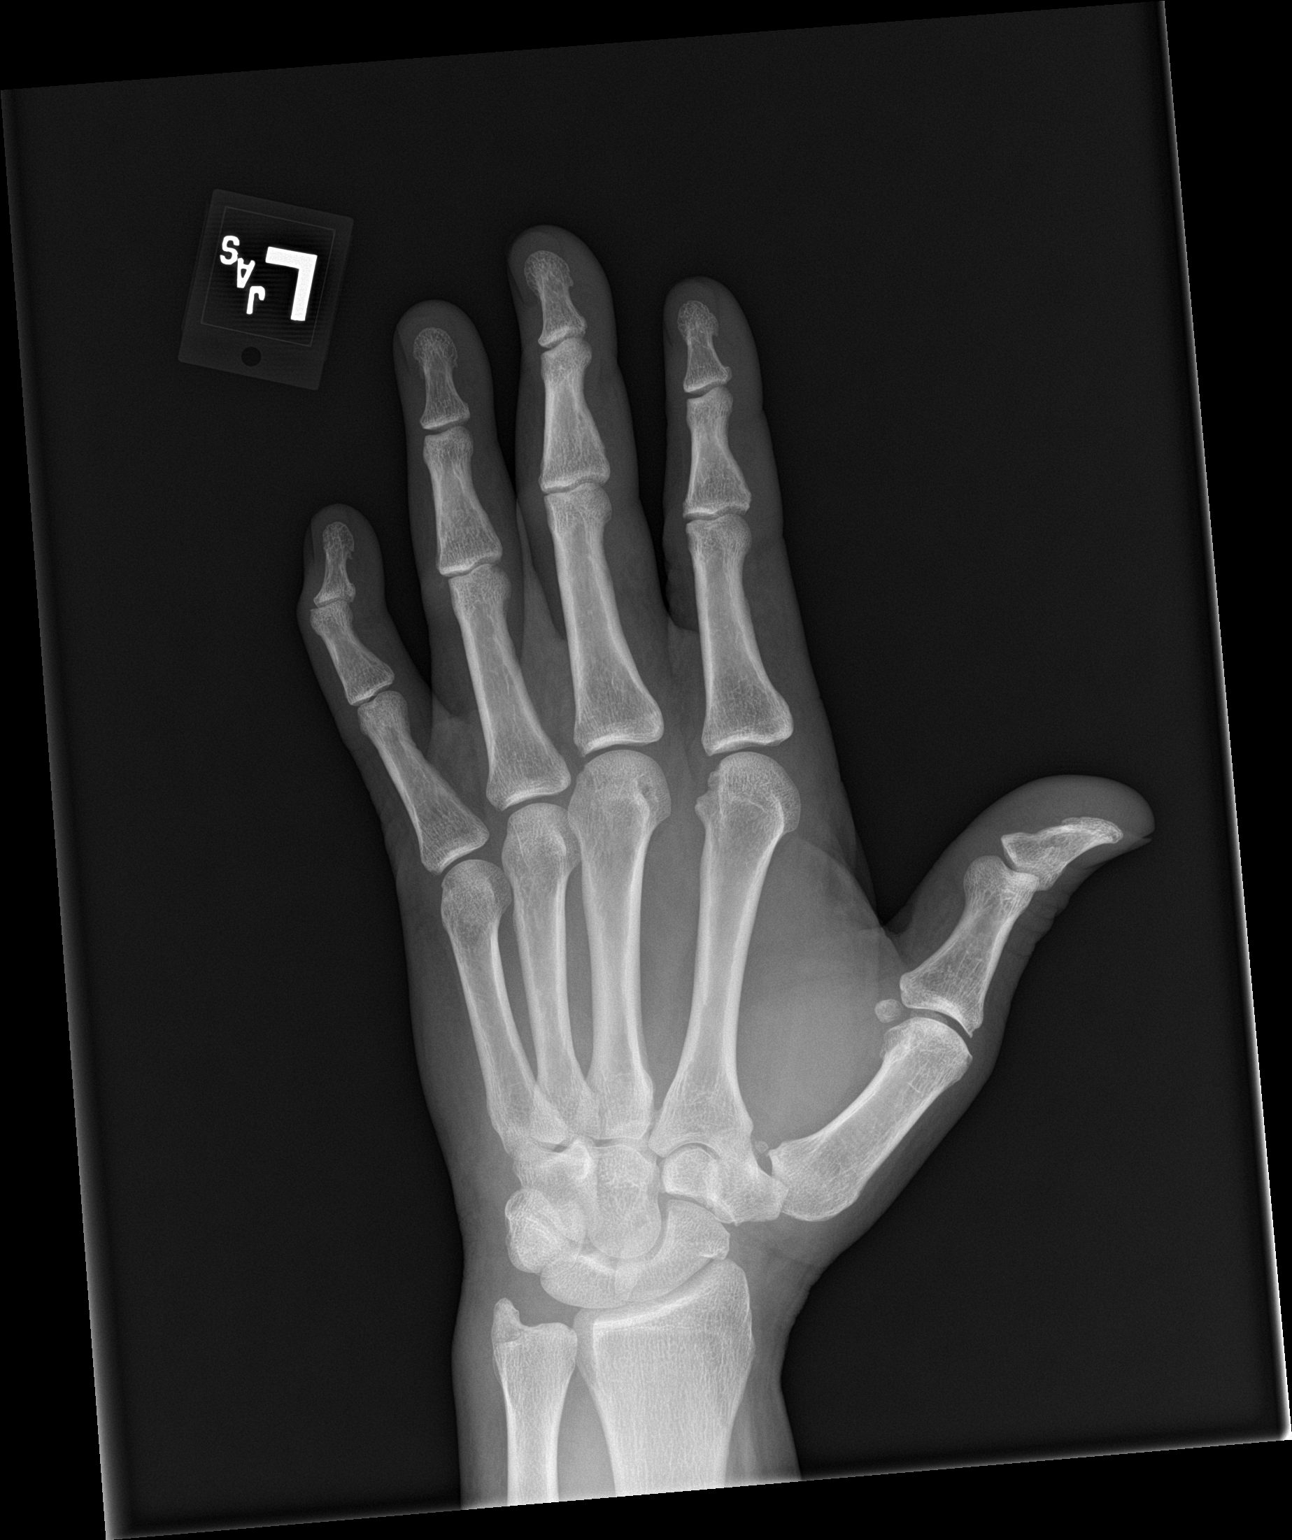
[im 3/3]
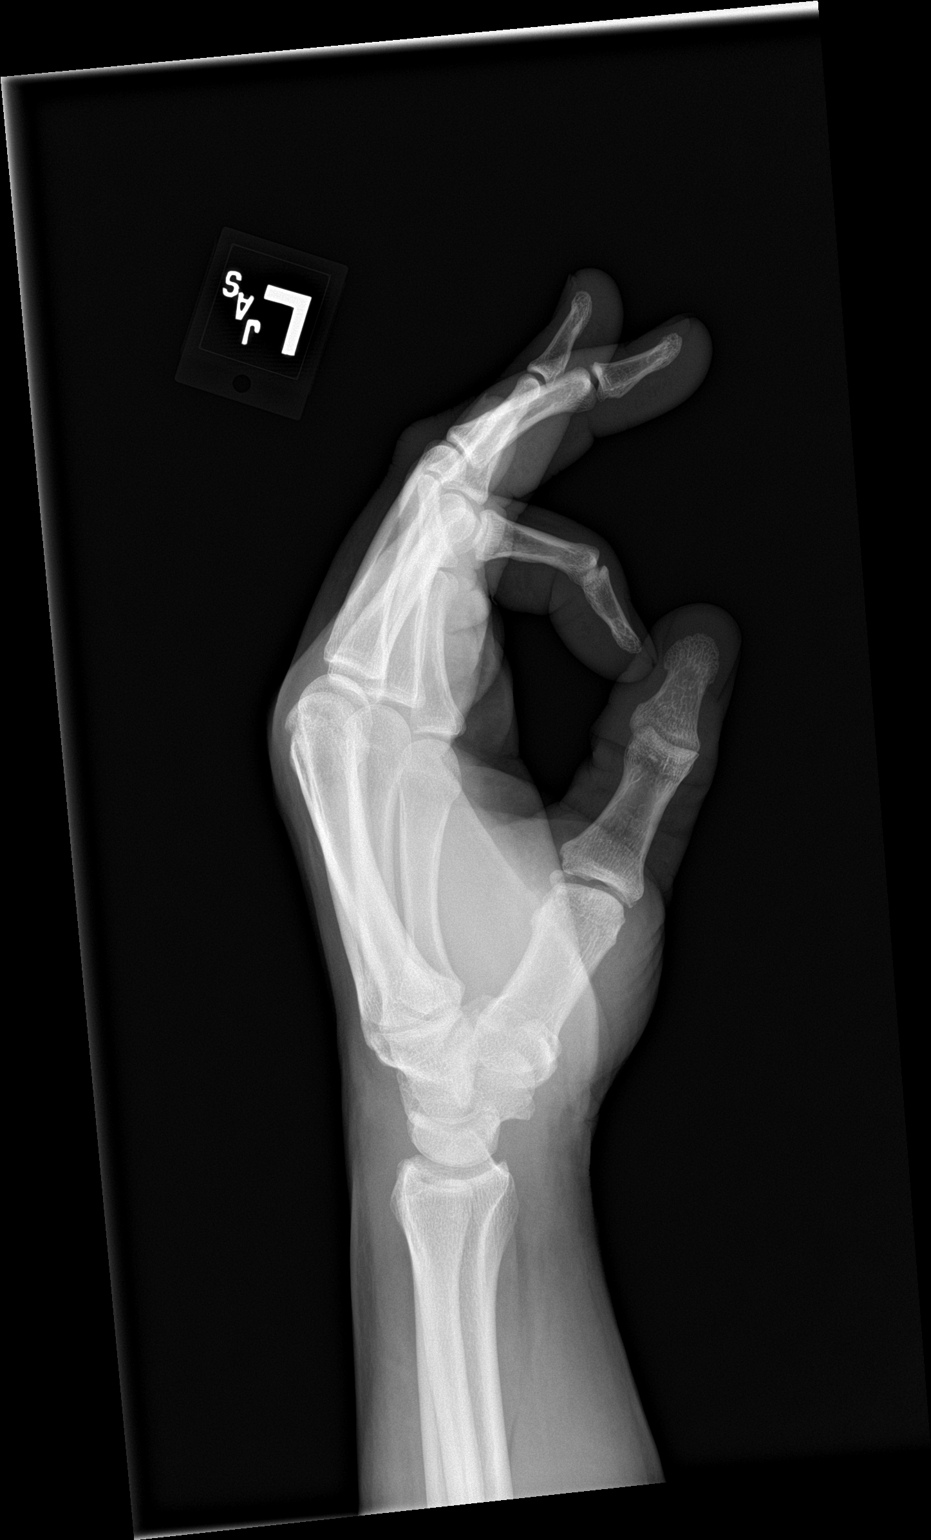

[3 of 3 positions shown; findings below may reference images not displayed]

FINDINGS: Normal anatomic position of the proximal interphalangeal joints of
the left third and fourth fingers postreduction. No acute fractures
identified. Mild soft tissue swelling of the third and fourth
fingers.
IMPRESSION: Successful interval reduction of the proximal interphalangeal joints
of the left third and fourth fingers.

## 2018-10-22 IMAGING — CT CT CERVICAL SPINE W/O CM
5 of 8 series · 11 of 33 positions shown, 12 images · non-contrast
Comparison: None.

CLINICAL DATA: Patient fell 15 feet from a ladder. History of
hypertension.

EXAM:
CT HEAD WITHOUT CONTRAST
CT CERVICAL SPINE WITHOUT CONTRAST
TECHNIQUE: Multidetector CT imaging of the head and cervical spine was
performed following the standard protocol without intravenous
contrast. Multiplanar CT image reconstructions of the cervical spine
were also generated.

[Series 4: head bone · axial · 0.43mm/px · z∈[+1227,+1287]mm · 2 of 90 slices shown]
[im 30/90  bone]
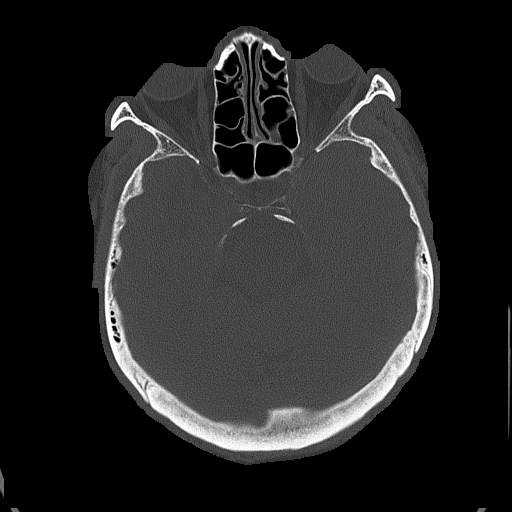
[im 60/90  bone]
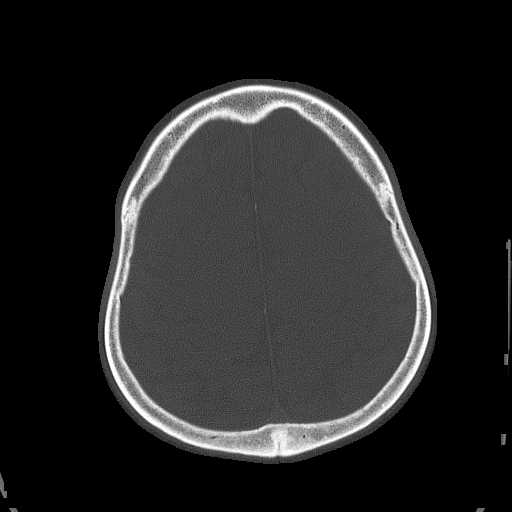

[Series 7: c_spine 2.0 st · axial · 0.28mm/px · z∈[+1066,+1130]mm · 2 of 97 slices shown, 3 images]
[im 33/97  soft-tissue]
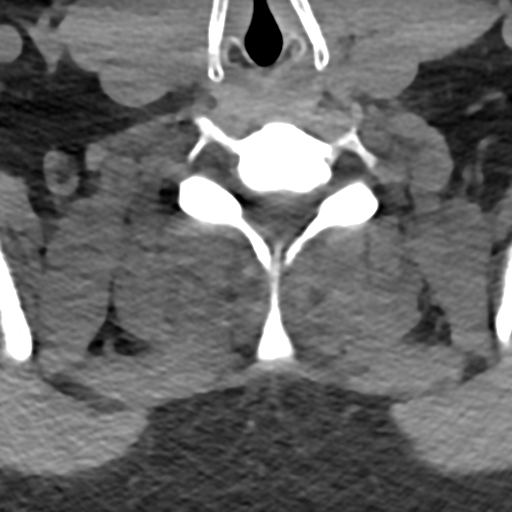
[im 33/97  bone]
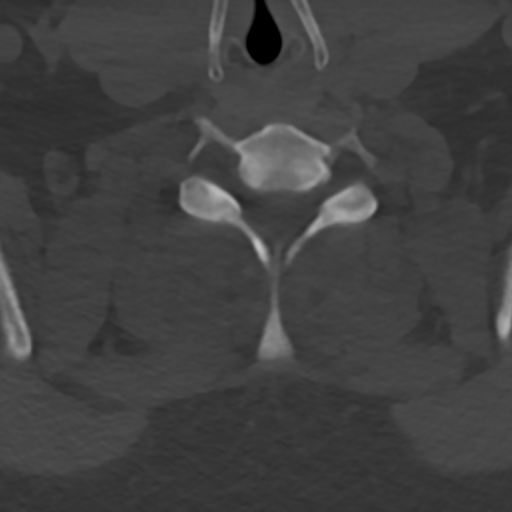
[im 65/97  bone]
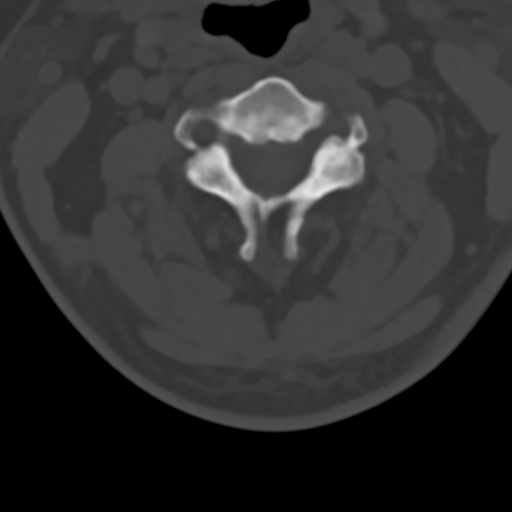

[Series 11: c_spine 2.0 sag bone · sagittal · 0.29mm/px · 4 of 61 slices shown]
[im 13/61  bone]
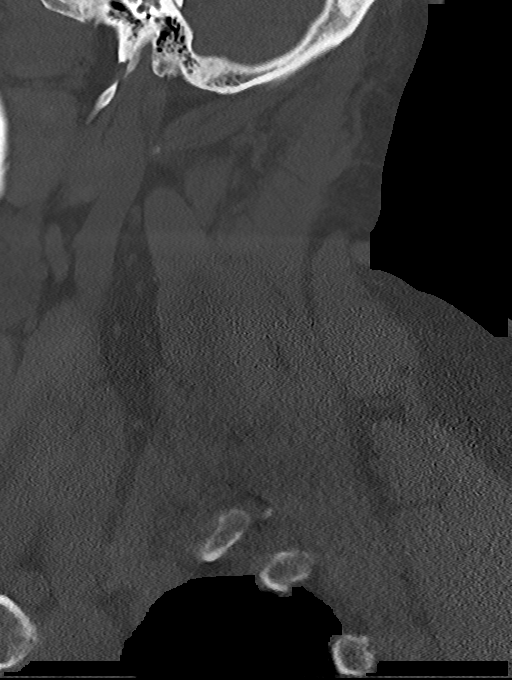
[im 25/61  bone]
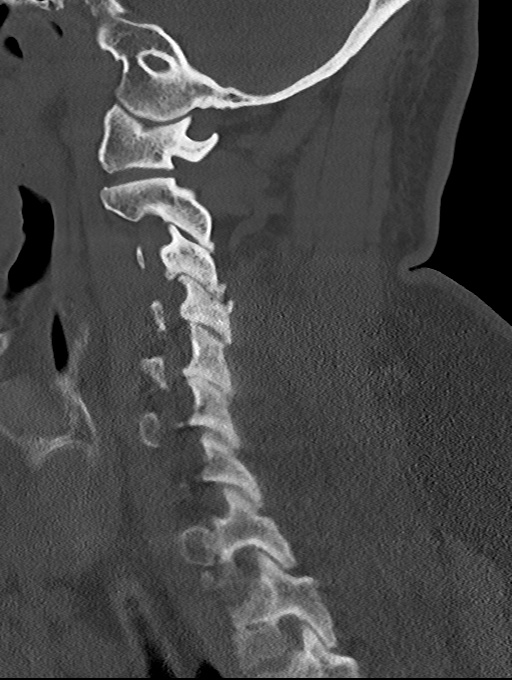
[im 37/61  bone]
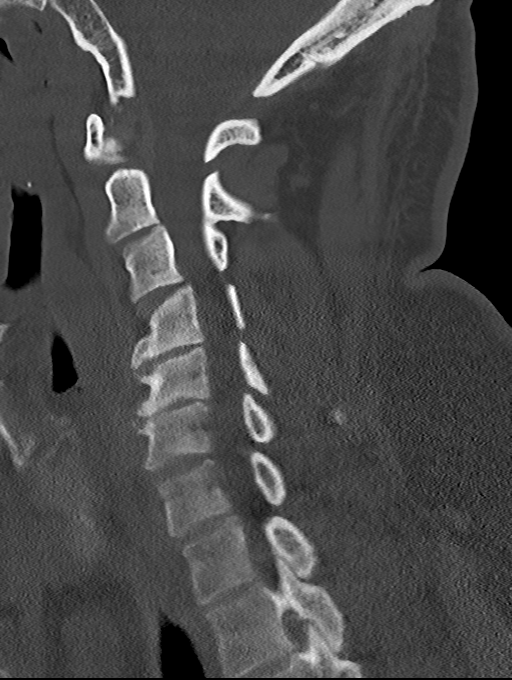
[im 49/61  bone]
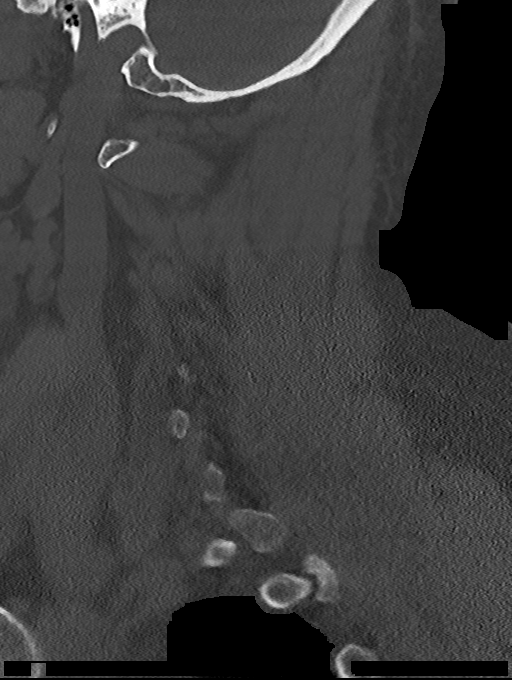

[Series 12: c_spine 2.0 cor bone · coronal · 0.29mm/px · 1 of 63 slices shown]
[im 32/63  bone]
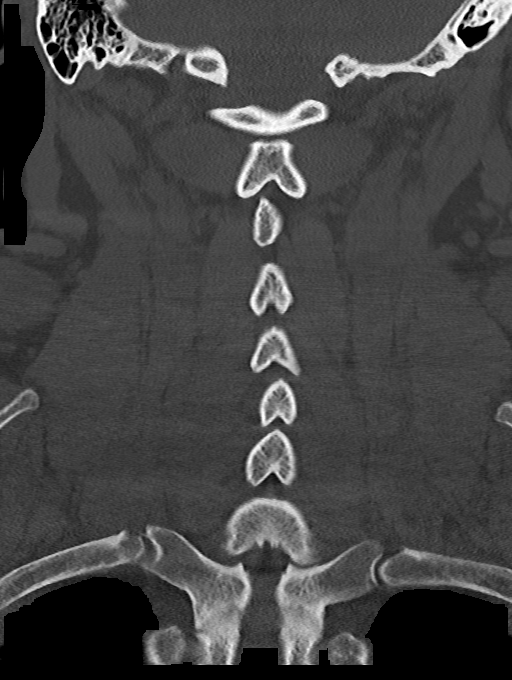

[Series 14: c_spine 2.0 orthogonals · axial · 0.21mm/px · z∈[+1049,+1102]mm · 2 of 94 slices shown]
[im 32/94  bone]
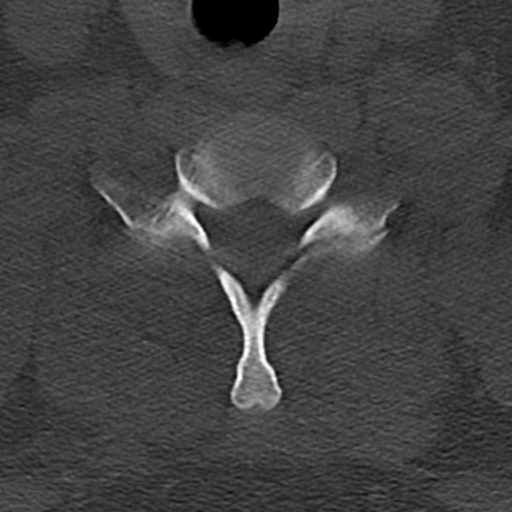
[im 63/94  bone]
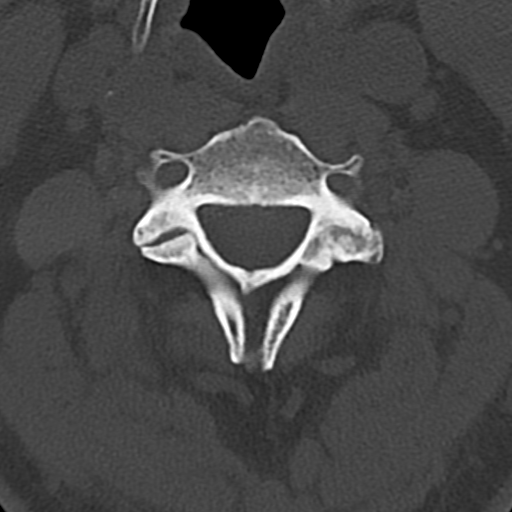

[11 of 33 positions shown; findings below may reference images not displayed]

FINDINGS: CT HEAD FINDINGS

Brain: Mild diffuse cerebral atrophy. No ventricular dilatation. No
mass effect or midline shift. No abnormal extra-axial fluid
collections. Gray-white matter junctions are distinct. Basal
cisterns are not effaced. No acute intracranial hemorrhage.

Vascular: No hyperdense vessel or unexpected calcification.

Skull: Calvarium appears intact. No acute depressed fractures
identified.

Sinuses/Orbits: Mucosal thickening in the paranasal sinuses. No
acute air-fluid levels. Mastoid air cells are not opacified.

Other: None.

CT CERVICAL SPINE FINDINGS

Alignment: Reversal of the usual cervical lordosis without anterior
subluxation. This is likely due to patient positioning but
ligamentous injury or muscle spasm could also have this appearance
and are not excluded. Normal alignment of the facet joints. C1-2
articulation appears intact.

Skull base and vertebrae: No vertebral compression deformities. No
focal bone lesion or bone destruction. Partial coalition of the
posterior elements at C2-3 on the left. Degenerative cyst in the
odontoid process. Old ununited ossicle of the anterior arch of C1 is
likely degenerative.

Soft tissues and spinal canal: No prevertebral soft tissue swelling.
No paraspinal infiltration.

Disc levels: Degenerative changes throughout the cervical spine with
narrowed interspaces and associated endplate hypertrophic changes.
Changes are most prominent at C4-5 and C5-6. Prominent posterior
disc osteophyte complex at C4-5 causes some effacement of the
anterior thecal sac. No significant stenosis.

Upper chest: Lung apices are clear.

Other: None.
IMPRESSION: 1. No acute intracranial abnormalities.  Mild diffuse atrophy.
2. Nonspecific reversal of the usual cervical lordosis. No acute
displaced fractures identified in the cervical spine. Degenerative
changes.

## 2020-02-09 NOTE — Progress Notes (Signed)
Preoperative Screening for Elective Surgery/Invasive Procedures While COVID-19 present in the community    ??? Have you tested positive or have been told to self-isolate for COVID-19 like symptoms within the past 28 days?  ??? Do you currently have any of the following symptoms?  o Fever >100.0 F or 99.9 F in immunocompromised patients?  o New onset cough, shortness of breath or difficulty breathing?  o New onset sore throat, myalgia (muscle aches and pains), headache, loss of taste/smell or diarrhea?  ??? Have you had a potential exposure to COVID-19 within the past 14 days by:  o Close contact with a confirmed case?  o Close contact with a healthcare worker, first responder or essential infrastructure worker (grocery store, restaurant, gas station, public utilities or transportation)?  o Do you reside in a congregate setting such as; skilled nursing facility, adult home, correctional facility, homeless shelter or other institutional setting?  o Have you had recent travel to a known COVID-19 hotspot?    Indicate if the patient has a positive screen by answering yes to one or more of the above questions.  Patients who test positive or screen positive prior to surgery or on the day of surgery should be evaluated in conjunction with the surgeon/proceduralist/anesthesiologist to determine the urgency of the procedure.    No to all questions above.

## 2020-02-09 NOTE — Progress Notes (Signed)
[facility] PRE-OPERATIVE INSTRUCTIONS    Arrival time__0700 2/1/22__________        Surgery time_____0800_______    Take the following medications with a sip of water:         You must make arrangements for a responsible adult to take you home after your surgery.    For your safety you will not be allowed to leave alone or drive yourself home.  Your surgery will be cancelled if you do not have a ride home.     Also for your safety, it is strongly suggested that someone stay with you the first 24 hours after your surgery.         If you have a living will and a durable power of attorney for healthcare, please bring in a copy.     You will need to bring a photo ID and insurance card        Our goal is to provide you with excellent care, therefore, visitors will be limited to two(2) in the room at a time so that we may focus on providing this care for you.          Please contact pre-admission testing if you have any further questions.                 [place] phone number:  [phone]    Please note these are generalized instructions for all surgical cases, you may be provided with more specific instructions according to your surgery.

## 2020-02-15 ENCOUNTER — Inpatient Hospital Stay: Payer: MEDICARE

## 2020-02-15 ENCOUNTER — Ambulatory Visit: Admit: 2020-02-15 | Primary: Family Medicine

## 2020-02-15 MED ORDER — LIDOCAINE HCL (PF) 2 % IJ SOLN
2 % | INTRAMUSCULAR | Status: DC | PRN
Start: 2020-02-15 — End: 2020-02-15
  Administered 2020-02-15: 14:00:00 40 via INTRAVENOUS

## 2020-02-15 MED ORDER — PROPOFOL 200 MG/20ML IV EMUL
200 MG/20ML | INTRAVENOUS | Status: DC | PRN
Start: 2020-02-15 — End: 2020-02-15
  Administered 2020-02-15 (×4): 50 via INTRAVENOUS
  Administered 2020-02-15: 14:00:00 80 via INTRAVENOUS
  Administered 2020-02-15: 14:00:00 50 via INTRAVENOUS

## 2020-02-15 MED ORDER — NORMAL SALINE FLUSH 0.9 % IV SOLN
0.9 % | INTRAVENOUS | Status: DC | PRN
Start: 2020-02-15 — End: 2020-02-15

## 2020-02-15 MED ORDER — SODIUM CHLORIDE 0.9 % IV SOLN
0.9 % | INTRAVENOUS | Status: DC
Start: 2020-02-15 — End: 2020-02-15
  Administered 2020-02-15: 14:00:00 via INTRAVENOUS

## 2020-02-15 MED ORDER — SODIUM CHLORIDE 0.9 % IV SOLN
0.9 % | INTRAVENOUS | Status: DC | PRN
Start: 2020-02-15 — End: 2020-02-15

## 2020-02-15 MED ORDER — NORMAL SALINE FLUSH 0.9 % IV SOLN
0.9 % | Freq: Two times a day (BID) | INTRAVENOUS | Status: DC
Start: 2020-02-15 — End: 2020-02-15

## 2020-02-15 MED ORDER — PROPOFOL 200 MG/20ML IV EMUL
200 MG/20ML | INTRAVENOUS | Status: AC
Start: 2020-02-15 — End: ?

## 2020-02-15 MED ORDER — LIDOCAINE HCL (PF) 2 % IJ SOLN
2 % | INTRAMUSCULAR | Status: AC
Start: 2020-02-15 — End: ?

## 2020-02-15 MED ORDER — GLYCOPYRROLATE 0.2 MG/ML IJ SOLN
0.2 MG/ML | INTRAMUSCULAR | Status: AC
Start: 2020-02-15 — End: ?

## 2020-02-15 MED FILL — DIPRIVAN 200 MG/20ML IV EMUL: 200 MG/20ML | INTRAVENOUS | Qty: 20

## 2020-02-15 MED FILL — GLYCOPYRROLATE 0.2 MG/ML IJ SOLN: 0.2 MG/ML | INTRAMUSCULAR | Qty: 1

## 2020-02-15 MED FILL — XYLOCAINE-MPF 2 % IJ SOLN: 2 % | INTRAMUSCULAR | Qty: 5

## 2020-02-15 NOTE — Progress Notes (Signed)
Discharge instructions reviewed with pt and family. Verbalized understanding. Pt ready for discharge.

## 2020-02-15 NOTE — Op Note (Signed)
Operative Note      Patient: Brandon Mosley  Date of Birth: 1950-03-21  MRN: [id number]    Date of Procedure: 02/15/2020    Pre-Op Diagnosis: Screen for colon cancer    Post-Op Diagnosis: Same       Procedure(s):  COLORECTAL CANCER SCREENING, NOT HIGH RISK    Surgeon(s):  Irena Cords, MD    Assistant:   * No surgical staff found *    Anesthesia: Monitor Anesthesia Care    Estimated Blood Loss (mL): None    Complications: None    Specimens:   * No specimens in log *    Implants:  * No implants in log *      Drains: * No LDAs found *    Findings: See dictated report    Detailed Description of Procedure:   See dictated report    Electronically signed by Irena Cords, MD on 02/15/2020 at 9:20 AM

## 2020-02-15 NOTE — Brief Op Note (Signed)
Brief Postoperative Note    Brandon Mosley  Date of Birth:  08/06/1950  [id number]      Pre-operative Diagnosis: Polyp surveillance    Previous Colonoscopy: Yes  When: 02/02/15  Greater than 3 years? Yes      Post-operative Diagnosis: Same    Procedure: Colonoscopy    The preparation was good.    Anesthesia: MAC    Surgeons/Assistants: Jimmy Picket, MD    Estimated Blood Loss: None    Complications: None    Specimens: Was Not Obtained    Findings: See dictated report    Electronically signed by Jimmy Picket, MD on 07/24/2015 at 7:45 AM

## 2020-02-15 NOTE — Anesthesia Post-Procedure Evaluation (Signed)
Department of Anesthesiology  Postprocedure Note    Patient: Brandon Mosley  MRN: [id number]  Birthdate: 23-Apr-1950  Date of evaluation: 02/15/2020  Time:  10:28 AM     Procedure Summary     Date: 02/15/20 Room / Location: [place] 03 / [facility]    Anesthesia Start: [phone] Anesthesia Stop: 0917    Procedure: [place], NOT HIGH RISK (N/A ) Diagnosis:       Screen for colon cancer      (Screen for colon cancer)    Surgeons: Jimmy Picket, MD Responsible Provider: Dolly Rias, MD    Anesthesia Type: MAC ASA Status: 3          Anesthesia Type: MAC    Aldrete Phase I: Aldrete Score: 10    Aldrete Phase II: Aldrete Score: 10    Last vitals: Reviewed and per EMR flowsheets.       Anesthesia Post Evaluation    Patient location during evaluation: PACU  Patient participation: complete - patient participated  Level of consciousness: awake and alert  Pain score: 0  Airway patency: patent  Nausea & Vomiting: no nausea and no vomiting  Complications: no  Cardiovascular status: blood pressure returned to baseline  Respiratory status: acceptable  Hydration status: euvolemic

## 2020-02-15 NOTE — Anesthesia Pre-Procedure Evaluation (Signed)
[facility] Department of Anesthesiology  Pre-Anesthesia Evaluation/Consultation       Name:  Brandon Mosley  DOB: Oct 13, 1950  Age:  70 y.o.                                           MRN:  [id number]  Date: 02/15/2020           Surgeon: Juliann Mule):  Jimmy Picket, MD    Procedure: Procedure(s):  COLORECTAL CANCER SCREENING, NOT HIGH RISK     No Known Allergies  Patient Active Problem List   Diagnosis   ??? Allergic rhinitis, seasonal     Past Medical History:   Diagnosis Date   ??? Accidental fall from ladder 2018   ??? 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 Closed fracture of rib of left side with routine healing 2018   ??? Closed fracture of second lumbar vertebra ([place]) 2018   ??? Constipation 2018    drug induced   ??? Dislocation of left middle finger 2018   ??? Hypertension 2018     Past Surgical History:   Procedure Laterality Date   ??? COLONOSCOPY  02/02/2015    Dr. Carlean Purl    ??? KNEE ARTHROPLASTY       Social History     Tobacco Use   ??? Smoking status: Former Smoker     Quit date: 1970     Years since quitting: 52.1   ??? Smokeless tobacco: Never Used   Substance Use Topics   ??? Alcohol use: Yes     Comment: occasionally   ??? Drug use: No     Medications  No current facility-administered medications on file prior to encounter.     Current Outpatient Medications on File Prior to Encounter   Medication Sig Dispense Refill   ??? tamsulosin (FLOMAX) 0.4 MG capsule Take 0.4 mg by mouth daily     ??? hydrALAZINE (APRESOLINE) 50 MG tablet Take 50 mg by mouth 2 times daily     ??? atorvastatin (LIPITOR) 10 MG tablet TAKE 1 TABLET BY MOUTH EVERY OTHER DAY     ??? amLODIPine (NORVASC) 10 MG tablet Take 10 mg by mouth daily     ??? ferrous sulfate 325 (65 FE) MG tablet Take 325 mg by mouth daily (with breakfast)     ??? fluticasone (FLONASE) 50 MCG/ACT nasal spray 1 SPRAY BY NASAL ROUTE DAILY. 1 Bottle 2   ??? lisinopril (PRINIVIL;ZESTRIL) 10 MG tablet Take 40 mg by mouth daily        Current Facility-Administered Medications   Medication  Dose Route Frequency Provider Last Rate Last Admin   ??? 0.9 % sodium chloride infusion   IntraVENous Continuous Galen Daft, MD       ??? sodium chloride flush 0.9 % injection 5-40 mL  5-40 mL IntraVENous 2 times per day Galen Daft, MD       ??? sodium chloride flush 0.9 % injection 5-40 mL  5-40 mL IntraVENous PRN Galen Daft, MD       ??? 0.9 % sodium chloride infusion  25 mL IntraVENous PRN Galen Daft, MD         Vital Signs (Current)   Vitals:    02/09/20 0951 02/15/20 0824   Weight: 232 lb (105.2 kg) 232 lb (105.2 kg)   Height: _0  (  1.803 m) _0  (1.803 m)                                            Vital Signs Statistics (for past 48 hrs)     No data recorded  BP Readings from Last 3 Encounters:   02/02/15 130/84   06/05/12 (!) 159/106       BMI  Body mass index is 32.36 kg/m??.  Estimated body mass index is 32.36 kg/m?? as calculated from the following:    Height as of this encounter: _1  (1.803 m).    Weight as of this encounter: 232 lb (105.2 kg).    CBC No results found for: WBC, RBC, HGB, HCT, MCV, RDW, PLT  CMP  No results found for: NA, K, CL, CO2, BUN, CREATININE, GFRAA, AGRATIO, LABGLOM, GLUCOSE, GLU, PROT, CALCIUM, BILITOT, ALKPHOS, AST, ALT  BMP  No results found for: NA, K, CL, CO2, BUN, CREATININE, CALCIUM, GFRAA, LABGLOM, GLUCOSE, GLU  POCGlucose  No results for input(s): GLUCOSE in the last 72 hours.   Coags  No results found for: PROTIME, INR, APTT  HCG (If Applicable) No results found for: PREGTESTUR, PREGSERUM, HCG, HCGQUANT   ABGs No results found for: PHART, PO2ART, PCO2ART, HCO3ART, BEART, O2SATART   Type & Screen (If Applicable)  No results found for: LABABO, LABRH                         BMI: Wt Readings from Last 3 Encounters:       NPO Status:  >8h                          Anesthesia Evaluation  Patient summary reviewed no history of anesthetic complications:   Airway: Mallampati: II  TM distance: >3 FB   Neck ROM: full  Mouth opening: > = 3 FB Dental: normal exam          Pulmonary: breath sounds clear to auscultation      (-) COPD, asthma, sleep apnea and not a current smoker                           Cardiovascular:    (+) hypertension:,     (-) past MI, CABG/stent,  angina and no hyperlipidemia        Rate: normal                    Neuro/Psych:      (-) seizures, TIA and CVA           GI/Hepatic/Renal:        (-) GERD, liver disease and no renal disease       Endo/Other:    (+) malignancy/cancer.    (-) diabetes mellitus, hypothyroidism               Abdominal:             Vascular:     - DVT and PE.      Other Findings:             Anesthesia Plan      MAC     ASA 3       Induction: intravenous.      Anesthetic plan and risks discussed with patient.  Plan discussed with CRNA.                  This pre-anesthesia assessment may be used as a history and physical.    DOS STAFF ADDENDUM:    Pt seen and examined, chart reviewed (including anesthesia, drug and allergy history).  No interval changes to history and physical examination.  Anesthetic plan, risks, benefits, alternatives, and personnel involved discussed with patient.  Patient verbalized an understanding and agrees to proceed.      Dolly Rias, MD  February 15, 2020  8:25 AM

## 2020-02-15 NOTE — H&P (Signed)
[facility]   Pre-operative History and Physical    Patient: Brandon Mosley  DOB: 1951/01/06  Acct#:         HISTORY OF PRESENT ILLNESS:    The patient is a 70 y.o. male  who presents with polyp surveillance  Past Medical History:        Diagnosis Date   ??? Accidental fall from ladder 2018   ??? 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 Closed fracture of rib of left side with routine healing 2018   ??? Closed fracture of second lumbar vertebra (HCC) 2018   ??? Constipation 2018    drug induced   ??? Dislocation of left middle finger 2018   ??? Hypertension 2018     Past Surgical History:        Procedure Laterality Date   ??? COLONOSCOPY  02/02/2015    Dr. Lorrene Reid    ??? KNEE ARTHROPLASTY       Medications prior to admission:   Prior to Admission medications    Medication Sig Start Date End Date Taking? Authorizing Provider   tamsulosin (FLOMAX) 0.4 MG capsule Take 0.4 mg by mouth daily   Yes Historical Provider, MD   hydrALAZINE (APRESOLINE) 50 MG tablet Take 50 mg by mouth 2 times daily 06/18/19  Yes Historical Provider, MD   atorvastatin (LIPITOR) 10 MG tablet TAKE 1 TABLET BY MOUTH EVERY OTHER DAY 07/29/19  Yes Historical Provider, MD   amLODIPine (NORVASC) 10 MG tablet Take 10 mg by mouth daily   Yes Historical Provider, MD   ferrous sulfate 325 (65 FE) MG tablet Take 325 mg by mouth daily (with breakfast)   Yes Historical Provider, MD   fluticasone (FLONASE) 50 MCG/ACT nasal spray 1 SPRAY BY NASAL ROUTE DAILY. 07/13/13  Yes Particia Nearing, MD   lisinopril (PRINIVIL;ZESTRIL) 10 MG tablet Take 40 mg by mouth daily    Yes Historical Provider, MD     Allergies:    Patient has no known allergies.  Social History:   Social History     Socioeconomic History   ??? Marital status: Married     Spouse name: Not on file   ??? Number of children: Not on file   ??? Years of education: Not on file   ??? Highest education level: Not on file   Occupational History   ??? Not on file   Tobacco Use   ??? Smoking status: Former Smoker     Quit date:  1970     Years since quitting: 52.1   ??? Smokeless tobacco: Never Used   Substance and Sexual Activity   ??? Alcohol use: Yes     Comment: occasionally   ??? Drug use: No   ??? Sexual activity: Not on file   Other Topics Concern   ??? Not on file   Social History Narrative   ??? Not on file     Social Determinants of Health     Financial Resource Strain:    ??? Difficulty of Paying Living Expenses: Not on file   Food Insecurity:    ??? Worried About Running Out of Food in the Last Year: Not on file   ??? Ran Out of Food in the Last Year: Not on file   Transportation Needs:    ??? Lack of Transportation (Medical): Not on file   ??? Lack of Transportation (Non-Medical): Not on file   Physical Activity:    ??? Days of Exercise  per Week: Not on file   ??? Minutes of Exercise per Session: Not on file   Stress:    ??? Feeling of Stress : Not on file   Social Connections:    ??? Frequency of Communication with Friends and Family: Not on file   ??? Frequency of Social Gatherings with Friends and Family: Not on file   ??? Attends Religious Services: Not on file   ??? Active Member of Clubs or Organizations: Not on file   ??? Attends Banker Meetings: Not on file   ??? Marital Status: Not on file   Intimate Partner Violence:    ??? Fear of Current or Ex-Partner: Not on file   ??? Emotionally Abused: Not on file   ??? Physically Abused: Not on file   ??? Sexually Abused: Not on file   Housing Stability:    ??? Unable to Pay for Housing in the Last Year: Not on file   ??? Number of Places Lived in the Last Year: Not on file   ??? Unstable Housing in the Last Year: Not on file           Family History:   History reviewed. No pertinent family history.      PHYSICAL EXAM:      Pulse 102    Temp 97.1 ??F (36.2 ??C) (Temporal)    Resp 17    Ht 5\' 11"  (1.803 m)    Wt 232 lb (105.2 kg)    SpO2 98%    BMI 32.36 kg/m??  I        Heart: Normal    Lungs: Normal    Abdomen: Normal      ASA Grade: ASA 3 - Patient with moderate systemic disease with functional limitations    II (soft  palate, uvula, fauces visible)  ASSESSMENT AND PLAN:    1.  Patient is a 70 y.o. male here for Colonoscopy  2.  Procedure options, risks and benefits reviewed with patient who expresses understanding.

## 2020-02-15 NOTE — Procedures (Signed)
[facility]           [address] [place], [place] [address]                                 PROCEDURE NOTE    PATIENT NAME: Brandon Mosley, Brandon Mosley                       DOB:        19-Oct-1950  MED REC NO:   [id number]                          ROOM:  ACCOUNT NO:   [id number]                           ADMIT DATE: 02/15/2020  PROVIDER:     Loraine Leriche A. Everlena Mackley, MD    DATE OF PROCEDURE:  02/15/2020    COLONOSCOPY    REFERRING PROVIDER:  Dr. Earleen Newport    PATIENT HISTORY:  This is a 70 year old male, outpatient.    INSTRUMENT USED:  Olympus PCF-H190L.    MEDICATIONS OF PROCEDURE:  The patient was premedicated with Diprivan  intravenously as administered by the anesthesiology service.    INDICATIONS:  The patient has a history of colonic polyps.    DESCRIPTION OF PROCEDURE:  The digital and anal exams were normal.  The  colonoscope was inserted to the cecum.  The prep overall was good.   There was incidental sigmoid diverticulosis.  No mucosal lesions were  identified.    ESTIMATED BLOOD LOSS:  None.    IMPRESSION:  Sigmoid diverticulosis only.    PLAN:  The patient should undergo a surveillance colonoscopy in 5 years.        Louana Fontenot A. Lorrene Reid, MD    D: 02/15/2020 9:34:00       T: 02/15/2020 9:37:26     [id number]  Job#: [id number]     Doc#: [id number]    CC:  Loraine Leriche A. Lorrene Reid, MD

## 2020-02-15 NOTE — Discharge Instructions (Signed)
[facility] Discharge Instructions  Colonoscopy    NAME:  Janmichael Giraud  DATE OF BIRTH:  16-Mar-1950  MEDICAL RECORD NUMBER:  [id number]  DATE:  02/15/2020      . After receiving Propofol (Diprivan) for Moderate Sedation:    o Do not drive or operate any machinery until tomorrow  o Do not sign any legal documents or make any critical decisions  o Do not drink alcoholic beverages for 24 hours  o Plan to spend a few hours resting before resuming your normal routine  o Possible side effects are light headedness and sedation    . You may resume your usual diet at home    . Resume all your daily medications    . Call your physician if any of the following occur:    o Severe abdominal distention and/or pain.  (Mild distention or cramping is normal after this procedure; this should improve within an hour or two with passage of air)  o Fever, chills, nausea or vomiting  o You may notice a small amount of blood in your next few bowel movements    . If excessive bleeding occurs:  Call your physician immediately or proceed to the nearest Emergency Room    . Biopsy Obtained: NO      Recommendations: Repeat colonoscopy in 5 years         For questions or concerns please contact your GI physician's 24 hour call center at [phone].

## 2020-04-05 LAB — PSA SCREENING: PSA: 1.29 ng/mL (ref 0.00–4.00)

## 2021-08-23 LAB — PSA SCREENING: PSA: 0.98 ng/mL (ref 0.00–4.00)

## 2021-08-24 LAB — COMPREHENSIVE METABOLIC PANEL
ALT: 20 U/L (ref 10–40)
AST: 26 U/L (ref 15–37)
Albumin/Globulin Ratio: 1.8 (ref 1.1–2.2)
Albumin: 4.4 g/dL (ref 3.4–5.0)
Alkaline Phosphatase: 103 U/L (ref 40–129)
Anion Gap: 10 (ref 3–16)
BUN: 16 mg/dL (ref 7–20)
CO2: 26 mmol/L (ref 21–32)
Calcium: 9 mg/dL (ref 8.3–10.6)
Chloride: 105 mmol/L (ref 99–110)
Creatinine: 1.2 mg/dL (ref 0.8–1.3)
Est, Glom Filt Rate: >60 (ref >60)
Glucose: 99 mg/dL (ref 70–99)
Potassium: 4.1 mmol/L (ref 3.5–5.1)
Sodium: 141 mmol/L (ref 136–145)
Total Bilirubin: 0.4 mg/dL (ref 0.0–1.0)
Total Protein: 6.9 g/dL (ref 6.4–8.2)

## 2021-08-24 LAB — LIPID PANEL
Cholesterol, Total: 179 mg/dL (ref 0–199)
HDL: 63 mg/dL — ABNORMAL HIGH (ref 40–60)
LDL Calculated: 103 mg/dL — ABNORMAL HIGH (ref <100)
Triglycerides: 65 mg/dL (ref 0–150)
VLDL Cholesterol Calculated: 13 mg/dL

## 2022-02-19 LAB — PSA SCREENING: PSA: 0.96 ng/mL (ref 0.00–4.00)

## 2022-03-28 NOTE — Telephone Encounter (Signed)
Formatting of this note might be different from the original.  Called patient, made contact. Notified patient of  Dr.Brown's result note.   Electronically signed by Cipriano Mile at 03/28/2022  3:32 PM EDT

## 2022-03-28 NOTE — Telephone Encounter (Signed)
Formatting of this note might be different from the original.  Pt called.  Asking for referral to ENT for an inflamed sinuses.  He has an appt next Friday but wants to get in somewhere else sooner.  Who do you recommend?  Electronically signed by Berenda Morale at 03/28/2022 12:24 PM EDT

## 2022-03-28 NOTE — Telephone Encounter (Signed)
Formatting of this note might be different from the original.  It is unlikely we would be able to get him in anywhere sooner as a new patient  Electronically signed by Radonna Ricker., MD at 03/28/2022 12:53 PM EDT

## 2022-04-03 ENCOUNTER — Ambulatory Visit: Admit: 2022-04-03 | Discharge: 2022-04-03 | Payer: MEDICARE | Attending: Otolaryngology | Primary: Family Medicine

## 2022-04-03 DIAGNOSIS — R070 Pain in throat: Secondary | ICD-10-CM

## 2022-04-03 NOTE — Progress Notes (Signed)
[facility]           NEW PATIENT VISIT      PCP:  Dorothy Puffer      REFERRED BY:   self        CHIEF COMPLAINT    Chief Complaint   Patient presents with    throat pain        HISTORY OF PRESENT ILLNESS      Brandon Mosley is a 72 y.o. male who presented today for evaluation and management for the above chief complaint.  "Last week having pain in my jaw [pointed to left TMJ area].  Saw my dentist, saw left side of my throat was inflamed.  Prescribed a Z-pack.  As the days went on, I was feeling it more in the throat and the sinus.  Dental x-rays were all negative.  It is much better now, only a slight pain, seems cleared up."  Has seasonal allergies in spring and fall.        REVIEW OF SYSTEMS   Review of Systems   Constitutional:  Negative for chills and fever.   HENT:  Negative for congestion, ear discharge, ear pain, hearing loss, rhinorrhea, sinus pain, sore throat and tinnitus.          PAST MEDICAL HISTORY    Past Medical History:   Diagnosis Date    Accidental fall from ladder 2018    Cancer 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 Carlean Purl     COLONOSCOPY N/A 02/15/2020    COLORECTAL CANCER SCREENING, NOT HIGH RISK performed by Jimmy Picket, MD at [facility]         Current Outpatient Medications   Medication Sig Dispense Refill    tamsulosin (FLOMAX) 0.4 MG capsule Take 1 capsule by mouth daily      hydrALAZINE (APRESOLINE) 50 MG tablet Take 1 tablet by mouth 2 times daily      atorvastatin (LIPITOR) 10 MG tablet TAKE 1 TABLET BY MOUTH EVERY OTHER DAY      amLODIPine (NORVASC) 10 MG tablet  Take 1 tablet by mouth daily      ferrous sulfate 325 (65 FE) MG tablet Take 1 tablet by mouth daily (with breakfast)      fluticasone (FLONASE) 50 MCG/ACT nasal spray 1 SPRAY BY NASAL ROUTE DAILY. 1 Bottle 2    lisinopril (PRINIVIL;ZESTRIL) 10 MG tablet Take 4 tablets by mouth daily       Current Facility-Administered Medications   Medication Dose Route Frequency Provider Last Rate Last Admin    methylPREDNISolone acetate (DEPO-MEDROL) injection 40 mg  40 mg IntraMUSCular Once Trenda Moots, MD             No Known Allergies      EXAMINATION    Vitals:    04/03/22 0925   BP: (!) 142/90   Site: Left Upper Arm   Position: Sitting   Cuff Size: Large Adult  Pulse: 72   Temp: 98.4 F (36.9 C)   TempSrc: Infrared   SpO2: 97%   Weight: 111.8 kg (246 lb 6.4 oz)   Height: 1.803 m (5' 10.98")     General:  WDWN, NAD, alert and oriented  Face:  TMJs were normal.  There was no swelling or lesions detected.    Voice:  Normal with no hoarseness or hot potato voice.    Ears:    AD: TM and EAC were normal, including normal pneumatic mobility of the TMs.    AS: TM and EAC were normal, including normal pneumatic mobility of the TMs.    The pinnae and mastoids appeared to be normal.   Binaural binocular otomicroscopy was performed.      Nose: The external nose, nasal septum, turbinates, secretions, and mucosa appeared to be normal.   Sinuses:  Maxillary and frontal sinuses were nontender to palpation and percussion.    Oral cavity:  Mucosa, secretions, tongue, and gingiva appeared to be normal.   Oropharynx:  The palatine tonsils, hard and soft palates, uvula, tongue, posterior oropharyngeal wall, mucosa and secretions appeared to be normal.     Salivary Glands:  Normal bilateral parotid and bilateral submandibular salivary glands.    Neck:  No masses or tenderness.  Trachea midline.  Laryngeal cartilages and hyoid bone normal.  Normal laryngeal crepitus.    Thyroid:  Normal, nontender, no goiter or nodules palpable.    Lymph  nodes:  No cervical lymphadenopathy.    I performed this head and neck physical exam personally.          IMPRESSION / DIAGNOSES / Brandon Mosley was seen today for throat pain.    Diagnoses and all orders for this visit:    Throat pain in adult  Comments:  Minimal currently, and subjectively improved and almost cleared up per patient.         RECOMMENDATIONS/PLAN      Acetaminophen (Tylenol) or Ibuprofen (Advil, Motrin IB) or naprosyn (Aleve) (over the counter medications) as needed for fever or pain.  Return if symptoms recur or, for any further ENT or sinus problems or symptoms.          Sherren Kerns. Dani Gobble, MD    [facility]  Department of Otolaryngology/Head and Neck Surgery  [facility]  [address], [place] [address]  [phone]

## 2022-08-22 LAB — PSA SCREENING: PSA: 0.85 ng/mL (ref 0.00–4.00)

## 2022-08-22 LAB — COMPREHENSIVE METABOLIC PANEL
ALT: 18 U/L (ref 10–40)
AST: 23 U/L (ref 15–37)
Albumin/Globulin Ratio: 1.7 (ref 1.1–2.2)
Albumin: 4.5 g/dL (ref 3.4–5.0)
Alkaline Phosphatase: 109 U/L (ref 40–129)
Anion Gap: 12 (ref 3–16)
BUN: 21 mg/dL — ABNORMAL HIGH (ref 7–20)
CO2: 26 mmol/L (ref 21–32)
Calcium: 9.6 mg/dL (ref 8.3–10.6)
Chloride: 103 mmol/L (ref 99–110)
Creatinine: 1.2 mg/dL (ref 0.8–1.3)
Est, Glom Filt Rate: 64 (ref >60)
Glucose: 100 mg/dL — ABNORMAL HIGH (ref 70–99)
Potassium: 5.1 mmol/L (ref 3.5–5.1)
Sodium: 141 mmol/L (ref 136–145)
Total Bilirubin: 0.3 mg/dL (ref 0.0–1.0)
Total Protein: 7.2 g/dL (ref 6.4–8.2)

## 2022-08-22 LAB — LIPID PANEL
Cholesterol, Total: 176 mg/dL (ref 0–199)
HDL: 61 mg/dL — ABNORMAL HIGH (ref 40–60)
LDL Cholesterol: 102 mg/dL — ABNORMAL HIGH (ref <100)
Triglycerides: 64 mg/dL (ref 0–150)
VLDL Cholesterol Calculated: 13 mg/dL

## 2023-02-19 LAB — PSA SCREENING: PSA: 0.96 ng/mL (ref 0.00–4.00)

## 2023-02-27 NOTE — Other (Unsigned)
 [facility]  [place]  [address] [place], [place] [id number]  Phone: [phone]  Fax: [phone]  02/27/2023  [facility] - Luisa Hart D. Purcell Mouton, MD [facility]  [address]  [place],  [id number]  RE: Brandon Mosley  DOB: 03/17/1950  Date of Visit: 02/27/2023  Dear Dr. Purcell Mouton,  Thank you for asking me to evaluate Brandon Mosley.  Chief Complaint:   - Generic CC/HPI:       Tobacco Use Screening and Cessation Intervention: Patient is age 73 or   older,Modifying Factors - [id number] Patient screened for tobacco use and is a    tobacco non user  - PROSTATE CA Follow Up:       Previous Treatment(s): Cyberknife       Associated Signs and Symptoms: Nocturia  4  to  5  hr       Last PSA value: 0.96       When where you diagnosed with Prostate Cancer?: Several Months       Additional Information: Pt here for yearly SBRT f/u; PSA will be reviewed       Date of last PSA: 02/19/2023  Diagnosis:   - R97.20 - Elevated prostate specific antigen [PSA], Chronic illnesses with   exacerbation, progression, or side effects of treatment, Active  - C61 - Malignant neoplasm of prostate, Stable Chronic Illness, Active  Review of Systems:   Constitutional : Feeling well, no chills, no fever, no weight gain, no fatigue  Integumentary : no rashes, no bruising no itching,  HEENT : No headache, no ringing of ears, no blurred vision, does no wears   glasses/contacts not hard of hearing, no sore throat, no nose bleeds  Allergic/Immunologic : No drug allergies, No seasonal allergies  Respiratory : No shortness of breath, no wheezing, no cough, no coughing up   blood, no snoring,  Cardiovascular : No chest pain, no abnormal blood pressure, no ankle swelling,   no palpitation, no skipped heartbeat, no varicose veins  Gastrointestinal : No abdominal pain, no nausea, no vomiting, no indigestion,   no heartburn, no constipation, no blood in stool, no diarrhea, no trouble   swallowing  Male GU : No blood in urine, nocturia, no  incomplete emptying of bladder, no   frequency, no testicular pain, no urinary incontinence  Musculoskeletal : No back pain, no joint pain, no neck pain, no arthritis, no   muscle cramps,  Neurological : No dizzy spells, no seizures, no stroke, no speech problem, no   tremors, no numbness,  Endocrine : No appetite changes, no excessive thirst,  Hematology : No anemia, Not easily bruised, does not use aspirin, does not use   anti-inflammatory drugs, no prolonged bleeding,  Psychologic : Is generally satisfied with life, no anxiety, no nervousness,   Not depressed  Physical Exam:   Today's Billable and Future Orders:   - G2211, Visit Complexity, 1.00 unit(s), Results: Patient under continued care   including testing, discussing treatment options, and shared decision making   in the treatment  Prescribed Medications:   - tamsulosin, 0.4 mg capsule, qty: 180,    oral  90  days supply  TAKE 2   CAPSULES BY MOUTH EVERY DAY, refills: 5, began: 01/16/2023  Provider Summary: Patient completed CyberKnife radiosurgery in 2021 his PSA at   the time of diagnosis was 4.99 he had 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  Overall patient is doing well he is sexually active he does not   require medication.  He is urinating well bowel movements are well at last had   an PSA bump from 0.7-0.98 but he admits to being sexually active prior to the   draw.  His PSA is now 0.97 this is essentially stable we will see him back in   4 months time.  Patient with intermediate unfavorable prostate cancer 4   months out his nadir at 0.9 is reasonable but stable no therapeutic action at   this time  Thank you for allowing me to participate in this patient's care.  I will keep   you informed of their progress.  Very truly yours,  Hilaria Ota MD

## 2023-08-20 LAB — CBC WITH AUTO DIFFERENTIAL
Basophils %: 0.9 %
Basophils Absolute: 0.1 K/uL (ref 0.0–0.2)
Eosinophils %: 6.6 %
Eosinophils Absolute: 0.4 K/uL (ref 0.0–0.6)
Hematocrit: 36.6 % — ABNORMAL LOW (ref 40.5–52.5)
Hemoglobin: 12 g/dL — ABNORMAL LOW (ref 13.5–17.5)
Lymphocytes %: 27.4 %
Lymphocytes Absolute: 1.8 K/uL (ref 1.0–5.1)
MCH: 22.2 pg — ABNORMAL LOW (ref 26.0–34.0)
MCHC: 32.7 g/dL (ref 31.0–36.0)
MCV: 67.7 fL — ABNORMAL LOW (ref 80.0–100.0)
MPV: 9 fL (ref 5.0–10.5)
Monocytes %: 9.6 %
Monocytes Absolute: 0.6 K/uL (ref 0.0–1.3)
Neutrophils %: 55.5 %
Neutrophils Absolute: 3.6 K/uL (ref 1.7–7.7)
PLATELET SLIDE REVIEW: ADEQUATE
Platelets: 197 K/uL (ref 135–450)
RBC: 5.4 M/uL (ref 4.20–5.90)
RDW: 15.9 % — ABNORMAL HIGH (ref 12.4–15.4)
WBC: 6.4 K/uL (ref 4.0–11.0)

## 2023-08-20 LAB — LIPID PANEL
Cholesterol, Total: 167 mg/dL (ref 0–199)
HDL: 56 mg/dL (ref 40–60)
LDL Cholesterol: 97 mg/dL (ref <100)
Triglycerides: 71 mg/dL (ref 0–150)
VLDL Cholesterol Calculated: 14 mg/dL

## 2023-08-20 LAB — COMPREHENSIVE METABOLIC PANEL
ALT: 22 U/L (ref 10–40)
AST: 26 U/L (ref 15–37)
Albumin/Globulin Ratio: 1.8 (ref 1.1–2.2)
Albumin: 4.2 g/dL (ref 3.4–5.0)
Alkaline Phosphatase: 93 U/L (ref 40–129)
Anion Gap: 8 (ref 3–16)
BUN: 18 mg/dL (ref 7–20)
CO2: 27 mmol/L (ref 21–32)
Calcium: 9.1 mg/dL (ref 8.3–10.6)
Chloride: 106 mmol/L (ref 99–110)
Creatinine: 1.3 mg/dL (ref 0.8–1.3)
Est, Glom Filt Rate: 58 — AB (ref >60)
Glucose: 100 mg/dL — ABNORMAL HIGH (ref 70–99)
Potassium: 5.1 mmol/L (ref 3.5–5.1)
Sodium: 141 mmol/L (ref 136–145)
Total Bilirubin: 0.3 mg/dL (ref 0.0–1.0)
Total Protein: 6.6 g/dL (ref 6.4–8.2)

## 2023-08-20 LAB — PSA SCREENING: PSA: 0.98 ng/mL (ref 0.00–4.00)

## 2023-08-21 LAB — HEMOGLOBIN A1C
Estimated Avg Glucose: 128.4 mg/dL
Hemoglobin A1C: 6.1 %

## 2023-08-21 LAB — BLOOD SMEAR REVIEW

## 2024-01-16 LAB — PSA SCREENING: PSA: 1.12 ng/mL (ref 0.00–4.00)
